# Patient Record
Sex: Male | Born: 1975 | Race: White | Hispanic: No | Marital: Married | Smoking: Never smoker
Health system: Southern US, Community
[De-identification: ages and names within clinical notes are randomized; demographics above are authoritative.]

## PROBLEM LIST (undated history)

## (undated) DIAGNOSIS — N5089 Other specified disorders of the male genital organs: Secondary | ICD-10-CM

## (undated) DIAGNOSIS — J45909 Unspecified asthma, uncomplicated: Secondary | ICD-10-CM

## (undated) DIAGNOSIS — R3915 Urgency of urination: Secondary | ICD-10-CM

## (undated) DIAGNOSIS — Z87442 Personal history of urinary calculi: Secondary | ICD-10-CM

## (undated) DIAGNOSIS — R350 Frequency of micturition: Secondary | ICD-10-CM

## (undated) DIAGNOSIS — Z9189 Other specified personal risk factors, not elsewhere classified: Secondary | ICD-10-CM

## (undated) DIAGNOSIS — C801 Malignant (primary) neoplasm, unspecified: Secondary | ICD-10-CM

---

## 2014-02-10 ENCOUNTER — Other Ambulatory Visit (HOSPITAL_COMMUNITY): Payer: Self-pay | Admitting: Urology

## 2014-02-10 ENCOUNTER — Other Ambulatory Visit: Payer: Self-pay | Admitting: Urology

## 2014-02-10 DIAGNOSIS — C6290 Malignant neoplasm of unspecified testis, unspecified whether descended or undescended: Secondary | ICD-10-CM

## 2014-02-11 ENCOUNTER — Encounter (HOSPITAL_COMMUNITY): Payer: Self-pay

## 2014-02-11 ENCOUNTER — Ambulatory Visit (HOSPITAL_COMMUNITY)
Admission: RE | Admit: 2014-02-11 | Discharge: 2014-02-11 | Disposition: A | Discharge status: Home / Self Care | Payer: Self-pay | Source: Ambulatory Visit | Attending: Urology | Admitting: Urology

## 2014-02-11 ENCOUNTER — Encounter (HOSPITAL_BASED_OUTPATIENT_CLINIC_OR_DEPARTMENT_OTHER): Payer: Self-pay | Admitting: *Deleted

## 2014-02-11 DIAGNOSIS — K4090 Unilateral inguinal hernia, without obstruction or gangrene, not specified as recurrent: Secondary | ICD-10-CM | POA: Insufficient documentation

## 2014-02-11 DIAGNOSIS — K5730 Diverticulosis of large intestine without perforation or abscess without bleeding: Secondary | ICD-10-CM | POA: Insufficient documentation

## 2014-02-11 DIAGNOSIS — C6290 Malignant neoplasm of unspecified testis, unspecified whether descended or undescended: Secondary | ICD-10-CM | POA: Insufficient documentation

## 2014-02-11 MED ORDER — IOHEXOL 300 MG/ML  SOLN
100.0000 mL | Freq: Once | INTRAMUSCULAR | Status: AC | PRN
  Administered 2014-02-11: 100 mL via INTRAVENOUS

## 2014-02-14 ENCOUNTER — Encounter (HOSPITAL_BASED_OUTPATIENT_CLINIC_OR_DEPARTMENT_OTHER): Payer: Self-pay | Admitting: *Deleted

## 2014-02-14 NOTE — Progress Notes (Signed)
   02/14/14 1033  OBSTRUCTIVE SLEEP APNEA  Have you ever been diagnosed with sleep apnea through a sleep study? No  Do you snore loudly (loud enough to be heard through closed doors)?  1  Do you often feel tired, fatigued, or sleepy during the daytime? 0  Has anyone observed you stop breathing during your sleep? 1  Do you have, or are you being treated for high blood pressure? 0  BMI more than 35 kg/m2? 1  Age over 36 years old? 0  Neck circumference greater than 40 cm/16 inches? 1  Gender: 1  Obstructive Sleep Apnea Score 5  Score 4 or greater  Results sent to PCP

## 2014-02-14 NOTE — Progress Notes (Signed)
NPO AFTER MN. ARRIVE AT 1100. NEEDS HG.

## 2014-02-15 ENCOUNTER — Encounter (HOSPITAL_BASED_OUTPATIENT_CLINIC_OR_DEPARTMENT_OTHER)
Admission: RE | Disposition: A | Discharge status: Home / Self Care | Payer: Self-pay | Source: Ambulatory Visit | Attending: Urology

## 2014-02-15 ENCOUNTER — Ambulatory Visit (HOSPITAL_BASED_OUTPATIENT_CLINIC_OR_DEPARTMENT_OTHER)
Admission: RE | Admit: 2014-02-15 | Discharge: 2014-02-15 | Disposition: A | Discharge status: Home / Self Care | Payer: Self-pay | Source: Ambulatory Visit | Attending: Urology | Admitting: Urology

## 2014-02-15 ENCOUNTER — Encounter (HOSPITAL_BASED_OUTPATIENT_CLINIC_OR_DEPARTMENT_OTHER): Payer: Self-pay

## 2014-02-15 ENCOUNTER — Ambulatory Visit (HOSPITAL_BASED_OUTPATIENT_CLINIC_OR_DEPARTMENT_OTHER): Payer: Self-pay | Admitting: Anesthesiology

## 2014-02-15 DIAGNOSIS — Z880 Allergy status to penicillin: Secondary | ICD-10-CM | POA: Insufficient documentation

## 2014-02-15 DIAGNOSIS — C6291 Malignant neoplasm of right testis, unspecified whether descended or undescended: Secondary | ICD-10-CM | POA: Insufficient documentation

## 2014-02-15 DIAGNOSIS — J45909 Unspecified asthma, uncomplicated: Secondary | ICD-10-CM | POA: Insufficient documentation

## 2014-02-15 DIAGNOSIS — R350 Frequency of micturition: Secondary | ICD-10-CM | POA: Insufficient documentation

## 2014-02-15 DIAGNOSIS — Z6841 Body Mass Index (BMI) 40.0 and over, adult: Secondary | ICD-10-CM | POA: Insufficient documentation

## 2014-02-15 DIAGNOSIS — Z91013 Allergy to seafood: Secondary | ICD-10-CM | POA: Insufficient documentation

## 2014-02-15 DIAGNOSIS — Z886 Allergy status to analgesic agent status: Secondary | ICD-10-CM | POA: Insufficient documentation

## 2014-02-15 DIAGNOSIS — R3915 Urgency of urination: Secondary | ICD-10-CM | POA: Insufficient documentation

## 2014-02-15 HISTORY — DX: Other specified disorders of the male genital organs: N50.89

## 2014-02-15 HISTORY — DX: Personal history of urinary calculi: Z87.442

## 2014-02-15 HISTORY — DX: Other specified personal risk factors, not elsewhere classified: Z91.89

## 2014-02-15 HISTORY — PX: ORCHIECTOMY: SHX2116

## 2014-02-15 HISTORY — DX: Urgency of urination: R39.15

## 2014-02-15 HISTORY — DX: Frequency of micturition: R35.0

## 2014-02-15 HISTORY — DX: Unspecified asthma, uncomplicated: J45.909

## 2014-02-15 LAB — POCT HEMOGLOBIN-HEMACUE: Hemoglobin: 14.6 g/dL (ref 13.0–17.0)

## 2014-02-15 SURGERY — ORCHIECTOMY
Anesthesia: General | Site: Groin | Laterality: Right

## 2014-02-15 MED ORDER — PROMETHAZINE HCL 25 MG/ML IJ SOLN
6.2500 mg | INTRAMUSCULAR | Status: DC | PRN
  Administered 2014-02-15: 6.25 mg via INTRAVENOUS
  Filled 2014-02-15: qty 1

## 2014-02-15 MED ORDER — OXYCODONE-ACETAMINOPHEN 5-325 MG PO TABS: 1.0000 | ORAL_TABLET | ORAL | Status: DC | PRN

## 2014-02-15 MED ORDER — LACTATED RINGERS IV SOLN
INTRAVENOUS | Status: DC
  Administered 2014-02-15 (×2): via INTRAVENOUS
  Filled 2014-02-15: qty 1000

## 2014-02-15 MED ORDER — PROPOFOL 10 MG/ML IV BOLUS
INTRAVENOUS | Status: DC | PRN
  Administered 2014-02-15: 200 mg via INTRAVENOUS

## 2014-02-15 MED ORDER — CIPROFLOXACIN IN D5W 400 MG/200ML IV SOLN
INTRAVENOUS | Status: AC
  Filled 2014-02-15: qty 200

## 2014-02-15 MED ORDER — BUPIVACAINE HCL (PF) 0.25 % IJ SOLN
INTRAMUSCULAR | Status: DC | PRN
  Administered 2014-02-15: 4 mL

## 2014-02-15 MED ORDER — CLINDAMYCIN PHOSPHATE 600 MG/50ML IV SOLN
600.0000 mg | Freq: Once | INTRAVENOUS | Status: DC
  Filled 2014-02-15: qty 50

## 2014-02-15 MED ORDER — FENTANYL CITRATE 0.05 MG/ML IJ SOLN
INTRAMUSCULAR | Status: AC
  Filled 2014-02-15: qty 6

## 2014-02-15 MED ORDER — MEPERIDINE HCL 25 MG/ML IJ SOLN
6.2500 mg | INTRAMUSCULAR | Status: DC | PRN
  Filled 2014-02-15: qty 1

## 2014-02-15 MED ORDER — FENTANYL CITRATE 0.05 MG/ML IJ SOLN
INTRAMUSCULAR | Status: DC | PRN
  Administered 2014-02-15: 25 ug via INTRAVENOUS
  Administered 2014-02-15 (×3): 50 ug via INTRAVENOUS
  Administered 2014-02-15: 25 ug via INTRAVENOUS
  Administered 2014-02-15: 50 ug via INTRAVENOUS
  Administered 2014-02-15 (×5): 25 ug via INTRAVENOUS
  Administered 2014-02-15 (×2): 50 ug via INTRAVENOUS
  Administered 2014-02-15: 25 ug via INTRAVENOUS

## 2014-02-15 MED ORDER — SODIUM CHLORIDE 0.9 % IR SOLN
Status: DC | PRN
  Administered 2014-02-15: 500 mL

## 2014-02-15 MED ORDER — DEXAMETHASONE SODIUM PHOSPHATE 4 MG/ML IJ SOLN
INTRAMUSCULAR | Status: DC | PRN
  Administered 2014-02-15: 10 mg via INTRAVENOUS

## 2014-02-15 MED ORDER — CIPROFLOXACIN IN D5W 400 MG/200ML IV SOLN
INTRAVENOUS | Status: DC | PRN
  Administered 2014-02-15: 400 mg via INTRAVENOUS

## 2014-02-15 MED ORDER — FENTANYL CITRATE 0.05 MG/ML IJ SOLN
INTRAMUSCULAR | Status: AC
  Filled 2014-02-15: qty 4

## 2014-02-15 MED ORDER — OXYCODONE HCL 5 MG PO TABS
5.0000 mg | ORAL_TABLET | Freq: Once | ORAL | Status: AC | PRN
  Administered 2014-02-15: 5 mg via ORAL
  Filled 2014-02-15: qty 1

## 2014-02-15 MED ORDER — OXYCODONE HCL 5 MG/5ML PO SOLN
5.0000 mg | Freq: Once | ORAL | Status: AC | PRN
  Filled 2014-02-15: qty 5

## 2014-02-15 MED ORDER — MIDAZOLAM HCL 5 MG/5ML IJ SOLN
INTRAMUSCULAR | Status: DC | PRN
  Administered 2014-02-15: 2 mg via INTRAVENOUS

## 2014-02-15 MED ORDER — HYDROMORPHONE HCL 1 MG/ML IJ SOLN
INTRAMUSCULAR | Status: AC
  Filled 2014-02-15: qty 1

## 2014-02-15 MED ORDER — PROMETHAZINE HCL 25 MG/ML IJ SOLN
INTRAMUSCULAR | Status: AC
  Filled 2014-02-15: qty 1

## 2014-02-15 MED ORDER — OXYCODONE HCL 5 MG PO TABS
ORAL_TABLET | ORAL | Status: AC
  Filled 2014-02-15: qty 1

## 2014-02-15 MED ORDER — CEFAZOLIN SODIUM-DEXTROSE 2-3 GM-% IV SOLR
INTRAVENOUS | Status: AC
  Filled 2014-02-15: qty 50

## 2014-02-15 MED ORDER — ONDANSETRON HCL 4 MG/2ML IJ SOLN
INTRAMUSCULAR | Status: DC | PRN
  Administered 2014-02-15: 4 mg via INTRAVENOUS

## 2014-02-15 MED ORDER — HYDROMORPHONE HCL 1 MG/ML IJ SOLN
0.2500 mg | INTRAMUSCULAR | Status: DC | PRN
Start: 2014-02-15 — End: 2014-02-15
  Administered 2014-02-15 (×4): 0.5 mg via INTRAVENOUS
  Filled 2014-02-15: qty 1

## 2014-02-15 MED ORDER — CEFAZOLIN SODIUM-DEXTROSE 2-3 GM-% IV SOLR
2.0000 g | INTRAVENOUS | Status: DC
  Filled 2014-02-15: qty 50

## 2014-02-15 MED ORDER — CEFAZOLIN SODIUM 1-5 GM-% IV SOLN
1.0000 g | INTRAVENOUS | Status: DC
  Filled 2014-02-15: qty 50

## 2014-02-15 MED ORDER — MIDAZOLAM HCL 2 MG/2ML IJ SOLN
INTRAMUSCULAR | Status: AC
  Filled 2014-02-15: qty 2

## 2014-02-15 MED ORDER — LIDOCAINE HCL (CARDIAC) 20 MG/ML IV SOLN
INTRAVENOUS | Status: DC | PRN
  Administered 2014-02-15: 60 mg via INTRAVENOUS

## 2014-02-15 SURGICAL SUPPLY — 54 items
BENZOIN TINCTURE PRP APPL 2/3 (GAUZE/BANDAGES/DRESSINGS) ×3 IMPLANT
BLADE CLIPPER SURG (BLADE) ×3 IMPLANT
BLADE SURG 15 STRL LF DISP TIS (BLADE) ×1 IMPLANT
BLADE SURG 15 STRL SS (BLADE) ×2
BNDG GAUZE ELAST 4 BULKY (GAUZE/BANDAGES/DRESSINGS) ×3 IMPLANT
CANISTER SUCTION 1200CC (MISCELLANEOUS) ×3 IMPLANT
CLEANER CAUTERY TIP 5X5 PAD (MISCELLANEOUS) ×1 IMPLANT
CLOSURE WOUND 1/4X4 (GAUZE/BANDAGES/DRESSINGS) ×1
CLOTH BEACON ORANGE TIMEOUT ST (SAFETY) ×3 IMPLANT
COVER MAYO STAND STRL (DRAPES) ×3 IMPLANT
COVER TABLE BACK 60X90 (DRAPES) ×3 IMPLANT
DERMABOND ADVANCED (GAUZE/BANDAGES/DRESSINGS) ×2
DERMABOND ADVANCED .7 DNX12 (GAUZE/BANDAGES/DRESSINGS) ×1 IMPLANT
DISSECTOR ROUND CHERRY 3/8 STR (MISCELLANEOUS) ×3 IMPLANT
DRAIN PENROSE 18X1/4 LTX STRL (WOUND CARE) ×3 IMPLANT
DRAPE LAPAROTOMY TRNSV 102X78 (DRAPE) ×3 IMPLANT
DRSG TEGADERM 4X4.75 (GAUZE/BANDAGES/DRESSINGS) ×3 IMPLANT
ELECT REM PT RETURN 9FT ADLT (ELECTROSURGICAL) ×3
ELECTRODE REM PT RTRN 9FT ADLT (ELECTROSURGICAL) ×1 IMPLANT
GLOVE BIO SURGEON STRL SZ7 (GLOVE) ×3 IMPLANT
GLOVE BIO SURGEON STRL SZ7.5 (GLOVE) ×3 IMPLANT
GLOVE INDICATOR 7.5 STRL GRN (GLOVE) ×3 IMPLANT
GLOVE SURG SS PI 7.5 STRL IVOR (GLOVE) ×6 IMPLANT
GOWN STRL REUS W/ TWL LRG LVL3 (GOWN DISPOSABLE) ×1 IMPLANT
GOWN STRL REUS W/ TWL XL LVL3 (GOWN DISPOSABLE) ×2 IMPLANT
GOWN STRL REUS W/TWL LRG LVL3 (GOWN DISPOSABLE) ×2
GOWN STRL REUS W/TWL XL LVL3 (GOWN DISPOSABLE) ×4
NEEDLE HYPO 25X1 1.5 SAFETY (NEEDLE) ×3 IMPLANT
NS IRRIG 500ML POUR BTL (IV SOLUTION) IMPLANT
PACK BASIN DAY SURGERY FS (CUSTOM PROCEDURE TRAY) ×3 IMPLANT
PAD CLEANER CAUTERY TIP 5X5 (MISCELLANEOUS) ×2
PENCIL BUTTON HOLSTER BLD 10FT (ELECTRODE) ×3 IMPLANT
SPONGE GAUZE 4X4 12PLY STER LF (GAUZE/BANDAGES/DRESSINGS) ×3 IMPLANT
STRIP CLOSURE SKIN 1/4X4 (GAUZE/BANDAGES/DRESSINGS) ×2 IMPLANT
SUPPORT SCROTAL LG STRP (MISCELLANEOUS) ×2 IMPLANT
SUPPORTER ATHLETIC LG (MISCELLANEOUS) ×1
SUT MNCRL AB 4-0 PS2 18 (SUTURE) ×3 IMPLANT
SUT SILK 0 SH 30 (SUTURE) IMPLANT
SUT SILK 0 TIES 10X30 (SUTURE) ×3 IMPLANT
SUT SILK 2 0 SH (SUTURE) IMPLANT
SUT VIC AB 0 CT1 36 (SUTURE) ×3 IMPLANT
SUT VIC AB 2-0 SH 27 (SUTURE) ×2
SUT VIC AB 2-0 SH 27XBRD (SUTURE) ×1 IMPLANT
SUT VIC AB 3-0 SH 27 (SUTURE) ×2
SUT VIC AB 3-0 SH 27X BRD (SUTURE) ×1 IMPLANT
SUT VICRYL 0 TIES 12 18 (SUTURE) ×3 IMPLANT
SYR 20CC LL (SYRINGE) ×6 IMPLANT
SYR BULB IRRIGATION 50ML (SYRINGE) ×3 IMPLANT
SYR CONTROL 10ML LL (SYRINGE) ×3 IMPLANT
TOWEL OR 17X24 6PK STRL BLUE (TOWEL DISPOSABLE) ×6 IMPLANT
TUBE CONNECTING 12'X1/4 (SUCTIONS) ×1
TUBE CONNECTING 12X1/4 (SUCTIONS) ×2 IMPLANT
WATER STERILE IRR 500ML POUR (IV SOLUTION) IMPLANT
YANKAUER SUCT BULB TIP NO VENT (SUCTIONS) ×3 IMPLANT

## 2014-02-15 NOTE — H&P (Signed)
H&P  Chief Complaint: Right testis mass  History of Present Illness: Devin Munoz is a 38 y.o. male with an atrophic right testicle and palpable mass confirmed on ultrasound.  To OR today for right radical orchiectomy.  Past Medical History  Diagnosis Date  . Testicular mass     RIGHT  . 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 Frequency of urination   . Urgency of urination   . At risk for sleep apnea     STOP-BANG= 5     SENT TO PCP 02-14-2014  . History of kidney stones     History reviewed. No pertinent past surgical history.  Home Medications:    Medication List    Notice    Cannot display discharge medications because the patient has not yet been admitted.      Allergies:  Allergies  Allergen Reactions  . Penicillins Anaphylaxis, Hives and Swelling  . Shellfish Allergy Hives    ALL TYPES  . Asa [Aspirin] Rash    History reviewed. No pertinent family history.  Social History:  reports that he has never smoked. He has never used smokeless tobacco. He reports that he does not drink alcohol or use illicit drugs.  ROS: A complete review of systems was performed.  All systems are negative except for pertinent findings as noted.  Physical Exam:  Vital signs in last 24 hours: Weight:  [145.151 kg (320 lb)] 145.151 kg (320 lb) (11/16 1033) Constitutional:  Alert and oriented, No acute distress Cardiovascular: Regular rate and rhythm, No JVD Respiratory: Normal respiratory effort, Lungs clear bilaterally GI: Abdomen is soft, nontender, nondistended, no abdominal masses GU: No CVA tenderness, right atrophic testicle with associated firm mass Lymphatic: No lymphadenopathy Neurologic: Grossly intact, no focal deficits Psychiatric: Normal mood and affect  Laboratory Data:  No results for input(s): WBC, HGB, HCT, PLT in the last 72 hours.  No results for input(s): NA, K, CL, GLUCOSE, BUN, CALCIUM, CREATININE in the last 72 hours.  Invalid input(s):  CO3   No results found for this or any previous visit (from the past 24 hour(s)). No results found for this or any previous visit (from the past 240 hour(s)).  Renal Function: No results for input(s): CREATININE in the last 168 hours. CrCl cannot be calculated (Patient has no serum creatinine result on file.).  Radiologic Imaging: No results found.  Impression/Assessment:  38 yo with right testis mass concerning for malignancy.  Plan is to go to OR today for right inguinal radical orchiectomy.  Risks and benefits were discussed in clinic this past week and all questions were answered.  Plan:  To OR today for right radical orchiectomy

## 2014-02-15 NOTE — Discharge Instructions (Addendum)
Discharge instructions following scrotal surgery  Call your doctor for:  Fever is greater than 100.5  Severe nausea or vomiting  Increasing pain not controlled by pain medication  Increasing redness or drainage from incisions  The number for questions or concerns is [phone]  Activity level: No lifting greater than 10 pounds (about equal to milk) for the next 2 weeks or until cleared to do so at follow-up appointment.  Otherwise activity as tolerated by comfort level.  Diet: May resume your regular diet as tolerated  Driving: No driving while still taking opiate pain medications (weight at least 6-8 hours after last dose).  No driving if you are still sore from surgery as it may limit ability to react quickly if necessary.   Shower/bath: May shower and get incision wet pad dry immediately following.  Do not scrub vigorously for the next 2-3 weeks.  Do not soak incision (ID soaking in bath or swimming) until told he may do so by Dr. Baltazar Najjar, as this may promote a wound infection.  Wound care: He may cover wounds with sterile gauze as needed to prevent incisions rubbing on close follow-up in any seepage.  Where tight fitting underpants for at least 2 weeks.  He should apply cold compresses (ice or sac of frozen peas/corn) to your scrotum for at least 48 hours to reduce the swelling.  You should expect that his scrotum will swell up initially and then get smaller over the next 2-4 weeks.  Follow-up appointments: Follow-up appointment will be scheduled with Dr. Baltazar Najjar in 2 weeks for a wound check.   Post Anesthesia Home Care Instructions  Activity: Get plenty of rest for the remainder of the day. A responsible adult should stay with you for 24 hours following the procedure.  For the next 24 hours, DO NOT: -Drive a car -Paediatric nurse -Drink alcoholic beverages -Take any medication unless instructed by your physician -Make any legal decisions or sign important  papers.  Meals: Start with liquid foods such as gelatin or soup. Progress to regular foods as tolerated. Avoid greasy, spicy, heavy foods. If nausea and/or vomiting occur, drink only clear liquids until the nausea and/or vomiting subsides. Call your physician if vomiting continues.  Special Instructions/Symptoms: Your throat may feel dry or sore from the anesthesia or the breathing tube placed in your throat during surgery. If this causes discomfort, gargle with warm salt water. The discomfort should disappear within 24 hours.

## 2014-02-15 NOTE — Op Note (Signed)
Preoperative diagnosis:  1. Right testis mass  Postoperative diagnosis: 1. Same  Procedure(s): 1. Right radical orchiectomy  Surgeon: Lowella Bandy  Resident: Langley Adie, MD  Anesthesia: General  Complications: None apparent  EBL: Minimal  Specimens: Right testicle and cord for final pathology  Disposition of specimens: To pathology  Intraoperative findings: Right atrophic testicle, right solid mass on palpation of testicle  Indication: 37 to with right atrophic testicle and solid mass concerning for malignancy  Description of procedure:  After being properly identified in the preoperative holding area, the patient was taken to the operating room and induced to general anesthesia in the supine position. He was prepped and draped in the usual sterile fashion and a final timeout was called confirming information correct.  We began by sharply incising along Langer's lines approximately 2 cm cephalad to the right inguinal ligament. Using electrocautery, we dissected down to the fascia and opened this sharply extending down to the external ring. The ilioinguinal nerve was identified and swept off of the posterior aspect of the fascia. This is excluded medially from the dissection. The cord was then isolated and a Penrose drain passed around it. The proximal cord was then clamped twice with Claiborne Billings clamps for oncologic control. We then proceeded to deliver the right testicle from the right hemiscrotum. The gubernaculum was taken with electrocautery and attachments from cremasteric fibers were dissected free as well. At this point, the testicle was completely mobilized out of the incision. The cord was clamped again distal to the 2 Kelly clamps and then sharply dissected free. The testicle and cord were sent for final pathology. The cord was suture ligated twice using 0 Vicryl suture. A 0 silk tie was then used proximally for hemostasis as well as possible identification in the future should  retroperitoneal lymph node dissection be necessary.  We inspected the cord and appreciated good hemostasis. We then irrigated the incision with antibiotic irrigation. Further hemostasis was achieved with electrocautery.  Within tender attention towards closing. The fascia was reapproximated in a running fashion using 2-0 Vicryl suture. Subcutaneous tissues reapproximated using 3-0 Vicryl suture. Subcuticular 4-0 Monocryl was used at the skin and Dermabond used for dressing.  Patient was then awoken from general anesthesia having tolerated the procedure well and no apparent complications.

## 2014-02-15 NOTE — Anesthesia Postprocedure Evaluation (Signed)
Anesthesia Post Note  Patient: Devin Munoz  Procedure(s) Performed: Procedure(s) (LRB): INGUINAL RADICAL ORCHIECTOMY (Right)  Anesthesia type: General  Patient location: PACU  Post pain: Pain level controlled  Post assessment: Post-op Vital signs reviewed  Last Vitals: BP 145/84 mmHg  Pulse 78  Temp(Src) 36.8 C (Oral)  Resp 18  Ht 6\' 2"  (1.88 m)  Wt 319 lb 8 oz (144.924 kg)  BMI 41.00 kg/m2  SpO2 99%  Post vital signs: Reviewed  Level of consciousness: sedated  Complications: No apparent anesthesia complications

## 2014-02-15 NOTE — Anesthesia Preprocedure Evaluation (Signed)
Anesthesia Evaluation  Patient identified by MRN, date of birth, ID band Patient awake    Reviewed: Allergy & Precautions, H&P , NPO status , Patient's Chart, lab work & pertinent test results  Airway Mallampati: II  TM Distance: >3 FB Neck ROM: Full    Dental no notable dental hx.    Pulmonary asthma ,  breath sounds clear to auscultation  Pulmonary exam normal       Cardiovascular negative cardio ROS  Rhythm:Regular Rate:Normal     Neuro/Psych negative neurological ROS  negative psych ROS   GI/Hepatic negative GI ROS, Neg liver ROS,   Endo/Other  Morbid obesity  Renal/GU negative Renal ROS     Musculoskeletal negative musculoskeletal ROS (+)   Abdominal   Peds  Hematology negative hematology ROS (+)   Anesthesia Other Findings   Reproductive/Obstetrics                             Anesthesia Physical Anesthesia Plan  ASA: III  Anesthesia Plan: General   Post-op Pain Management:    Induction: Intravenous  Airway Management Planned: LMA  Additional Equipment:   Intra-op Plan:   Post-operative Plan: Extubation in OR  Informed Consent: I have reviewed the patients History and Physical, chart, labs and discussed the procedure including the risks, benefits and alternatives for the proposed anesthesia with the patient or authorized representative who has indicated his/her understanding and acceptance.   Dental advisory given  Plan Discussed with: CRNA  Anesthesia Plan Comments:         Anesthesia Quick Evaluation

## 2014-02-15 NOTE — Transfer of Care (Signed)
Immediate Anesthesia Transfer of Care Note  Patient: Devin Munoz  Procedure(s) Performed: Procedure(s) (LRB): INGUINAL RADICAL ORCHIECTOMY (Right)  Patient Location: PACU  Anesthesia Type: General  Level of Consciousness: awake, oriented, sedated and patient cooperative  Airway & Oxygen Therapy: Patient Spontanous Breathing and Patient connected to face mask oxygen  Post-op Assessment: Report given to PACU RN and Post -op Vital signs reviewed and stable  Post vital signs: Reviewed and stable  Complications: No apparent anesthesia complications

## 2014-02-15 NOTE — Anesthesia Procedure Notes (Signed)
Procedure Name: LMA Insertion Date/Time: 02/15/2014 12:50 PM Performed by: Denna Haggard D Pre-anesthesia Checklist: Patient identified, Emergency Drugs available, Suction available and Patient being monitored Patient Re-evaluated:Patient Re-evaluated prior to inductionOxygen Delivery Method: Circle System Utilized Preoxygenation: Pre-oxygenation with 100% oxygen Intubation Type: IV induction Ventilation: Mask ventilation without difficulty LMA: LMA inserted LMA Size: 5.0 Number of attempts: 1 Airway Equipment and Method: bite block Placement Confirmation: positive ETCO2 Tube secured with: Tape Dental Injury: Teeth and Oropharynx as per pre-operative assessment

## 2014-02-17 ENCOUNTER — Encounter (HOSPITAL_BASED_OUTPATIENT_CLINIC_OR_DEPARTMENT_OTHER): Payer: Self-pay | Admitting: Urology

## 2014-05-03 ENCOUNTER — Other Ambulatory Visit (HOSPITAL_COMMUNITY): Payer: Self-pay | Admitting: Urology

## 2014-05-03 DIAGNOSIS — C6290 Malignant neoplasm of unspecified testis, unspecified whether descended or undescended: Secondary | ICD-10-CM

## 2014-05-04 ENCOUNTER — Other Ambulatory Visit (HOSPITAL_COMMUNITY): Payer: Self-pay | Admitting: Urology

## 2014-05-04 DIAGNOSIS — C6290 Malignant neoplasm of unspecified testis, unspecified whether descended or undescended: Secondary | ICD-10-CM

## 2014-05-13 ENCOUNTER — Ambulatory Visit (HOSPITAL_COMMUNITY)
Admission: RE | Admit: 2014-05-13 | Discharge: 2014-05-13 | Disposition: A | Discharge status: Home / Self Care | Payer: Self-pay | Source: Ambulatory Visit | Attending: Urology | Admitting: Urology

## 2014-05-13 ENCOUNTER — Encounter (HOSPITAL_COMMUNITY): Payer: Self-pay

## 2014-05-13 DIAGNOSIS — C6290 Malignant neoplasm of unspecified testis, unspecified whether descended or undescended: Secondary | ICD-10-CM | POA: Insufficient documentation

## 2014-05-13 MED ORDER — IOHEXOL 300 MG/ML  SOLN
100.0000 mL | Freq: Once | INTRAMUSCULAR | Status: AC | PRN
  Administered 2014-05-13: 100 mL via INTRAVENOUS

## 2014-05-13 MED ORDER — IOHEXOL 300 MG/ML  SOLN
50.0000 mL | Freq: Once | INTRAMUSCULAR | Status: AC | PRN
  Administered 2014-05-13: 50 mL via ORAL

## 2014-07-18 ENCOUNTER — Other Ambulatory Visit (HOSPITAL_COMMUNITY): Payer: Self-pay | Admitting: Urology

## 2014-07-18 DIAGNOSIS — C6290 Malignant neoplasm of unspecified testis, unspecified whether descended or undescended: Secondary | ICD-10-CM

## 2014-08-10 ENCOUNTER — Other Ambulatory Visit (HOSPITAL_COMMUNITY): Payer: Self-pay | Admitting: Urology

## 2014-08-10 ENCOUNTER — Encounter (HOSPITAL_COMMUNITY): Payer: Self-pay

## 2014-08-10 ENCOUNTER — Ambulatory Visit (HOSPITAL_COMMUNITY)
Admission: RE | Admit: 2014-08-10 | Discharge: 2014-08-10 | Disposition: A | Discharge status: Home / Self Care | Payer: Self-pay | Source: Ambulatory Visit | Attending: Urology | Admitting: Urology

## 2014-08-10 DIAGNOSIS — C6290 Malignant neoplasm of unspecified testis, unspecified whether descended or undescended: Secondary | ICD-10-CM

## 2014-08-10 DIAGNOSIS — R109 Unspecified abdominal pain: Secondary | ICD-10-CM | POA: Insufficient documentation

## 2014-08-10 HISTORY — DX: Malignant (primary) neoplasm, unspecified: C80.1

## 2014-08-10 MED ORDER — IOHEXOL 300 MG/ML  SOLN
100.0000 mL | Freq: Once | INTRAMUSCULAR | Status: AC | PRN
  Administered 2014-08-10: 100 mL via INTRAVENOUS

## 2014-09-29 ENCOUNTER — Other Ambulatory Visit (HOSPITAL_COMMUNITY): Payer: Self-pay | Admitting: Urology

## 2014-09-29 ENCOUNTER — Other Ambulatory Visit (HOSPITAL_COMMUNITY): Payer: Self-pay

## 2014-09-29 DIAGNOSIS — C6290 Malignant neoplasm of unspecified testis, unspecified whether descended or undescended: Secondary | ICD-10-CM

## 2014-10-19 ENCOUNTER — Other Ambulatory Visit: Payer: Self-pay | Admitting: Urology

## 2014-10-19 NOTE — Patient Instructions (Addendum)
[place]  10/19/2014   Your procedure is scheduled on:   10-21-2014 Friday  Enter through [facility]  Entrance and follow signs to [facility]. Arrive at       3:00 PM.  (Limit 1 person with you).  Call this number if you have problems the morning of surgery: [phone]  Or Presurgical Testing [phone].   For Living Will and/or Health Care Power Attorney Forms: please provide copy for your medical record,may bring AM of surgery(Forms should be already notarized -we do not provide this service).(10-20-14  No information preferred today).    Do not eat food/ or drink: After Midnight.  Exception: may have clear liquids:up to 6 Hours before arrival. Nothing after: 1100 !M  Clear liquids include soda, tea, black coffee, apple or grape juice, broth.  Take these medicines the morning of surgery with A SIP OF WATER: [place].   Do not wear jewelry, make-up or nail polish.  Do not wear deodorant, lotions, powders, or perfumes.   Do not shave legs and under arms- 48 hours(2 days) prior to first CHG shower.(Shaving face and neck okay.)  Do not bring valuables to the hospital.(Hospital is not responsible for lost valuables).  Contacts, dentures or removable bridgework, body piercing, hair pins may not be worn into surgery.  Leave suitcase in the car. After surgery it may be brought to your room.  For patients admitted to the hospital, checkout time is 11:00 AM the day of discharge.(Restricted visitors-Any Persons displaying flu-like symptoms or illness).    Patients discharged the day of surgery will not be allowed to drive home. Must have responsible person with you x 24 hours once discharged.  Name and phone number of your driver: Almyra Free -spouse [phone] cell.     Please read over the following fact sheets that you were given:  CHG(Chlorhexidine Gluconate 4% Surgical Soap) use.           [facility] - Preparing for Surgery Before surgery, you can play an  important role.  Because skin is not sterile, your skin needs to be as free of germs as possible.  You can reduce the number of germs on your skin by washing with CHG (chlorahexidine gluconate) soap before surgery.  CHG is an antiseptic cleaner which kills germs and bonds with the skin to continue killing germs even after washing. Please DO NOT use if you have an allergy to CHG or antibacterial soaps.  If your skin becomes reddened/irritated stop using the CHG and inform your nurse when you arrive at Short Stay. Do not shave (including legs and underarms) for at least 48 hours prior to the first CHG shower.  You may shave your face/neck. Please follow these instructions carefully:  1.  Shower with CHG Soap the night before surgery and the  morning of Surgery.  2.  If you choose to wash your hair, wash your hair first as usual with your  normal  shampoo.  3.  After you shampoo, rinse your hair and body thoroughly to remove the  shampoo.                           4.  Use CHG as you would any other liquid soap.  You can apply chg directly  to the skin and wash                       Gently with a  scrungie or clean washcloth.  5.  Apply the CHG Soap to your body ONLY FROM THE NECK DOWN.   Do not use on face/ open                           Wound or open sores. Avoid contact with eyes, ears mouth and genitals (private parts).                       Wash face,  Genitals (private parts) with your normal soap.             6.  Wash thoroughly, paying special attention to the area where your surgery  will be performed.  7.  Thoroughly rinse your body with warm water from the neck down.  8.  DO NOT shower/wash with your normal soap after using and rinsing off  the CHG Soap.                9.  Pat yourself dry with a clean towel.            10.  Wear clean pajamas.            11.  Place clean sheets on your bed the night of your first shower and do not  sleep with pets. Day of Surgery : Do not apply any  lotions/deodorants the morning of surgery.  Please wear clean clothes to the hospital/surgery center.  FAILURE TO FOLLOW THESE INSTRUCTIONS MAY RESULT IN THE CANCELLATION OF YOUR SURGERY PATIENT SIGNATURE_________________________________  NURSE SIGNATURE__________________________________  ________________________________________________________________________

## 2014-10-19 NOTE — Progress Notes (Signed)
Please put orders in Epic surgery 10-21-14 pre op 10-20-14 Thanks

## 2014-10-20 ENCOUNTER — Encounter (HOSPITAL_COMMUNITY)
Admission: RE | Admit: 2014-10-20 | Discharge: 2014-10-20 | Disposition: A | Discharge status: Home / Self Care | Payer: Self-pay | Source: Ambulatory Visit | Attending: Urology | Admitting: Urology

## 2014-10-20 ENCOUNTER — Encounter (HOSPITAL_COMMUNITY): Payer: Self-pay

## 2014-10-20 DIAGNOSIS — N211 Calculus in urethra: Secondary | ICD-10-CM | POA: Insufficient documentation

## 2014-10-20 DIAGNOSIS — Z01812 Encounter for preprocedural laboratory examination: Secondary | ICD-10-CM | POA: Insufficient documentation

## 2014-10-20 LAB — BASIC METABOLIC PANEL
Anion gap: 7 (ref 5–15)
BUN: 14 mg/dL (ref 6–20)
CHLORIDE: 106 mmol/L (ref 101–111)
CO2: 27 mmol/L (ref 22–32)
CREATININE: 0.85 mg/dL (ref 0.61–1.24)
Calcium: 9 mg/dL (ref 8.9–10.3)
GFR calc non Af Amer: >60 mL/min (ref >60)
GLUCOSE: 101 mg/dL — AB (ref 65–99)
Potassium: 4.4 mmol/L (ref 3.5–5.1)
Sodium: 140 mmol/L (ref 135–145)

## 2014-10-20 NOTE — Pre-Procedure Instructions (Signed)
CXR 08-10-14, CT abd/pelvis 08-10-14 Epic.

## 2014-10-20 NOTE — Anesthesia Preprocedure Evaluation (Addendum)
Anesthesia Evaluation  Patient identified by MRN, date of birth, ID band Patient awake    Reviewed: Allergy & Precautions, NPO status , Patient's Chart, lab work & pertinent test results  History of Anesthesia Complications Negative for: history of anesthetic complications  Airway Mallampati: II  TM Distance: >3 FB Neck ROM: Full    Dental no notable dental hx. (+) Dental Advisory Given   Pulmonary asthma ,  breath sounds clear to auscultation  Pulmonary exam normal       Cardiovascular negative cardio ROS Normal cardiovascular examRhythm:Regular Rate:Normal     Neuro/Psych negative neurological ROS  negative psych ROS   GI/Hepatic negative GI ROS, Neg liver ROS,   Endo/Other  Morbid obesity  Renal/GU negative Renal ROS  negative genitourinary   Musculoskeletal negative musculoskeletal ROS (+)   Abdominal (+) + obese,   Peds negative pediatric ROS (+)  Hematology negative hematology ROS (+)   Anesthesia Other Findings   Reproductive/Obstetrics negative OB ROS                            Anesthesia Physical Anesthesia Plan  ASA: III  Anesthesia Plan: General   Post-op Pain Management:    Induction: Intravenous  Airway Management Planned: LMA  Additional Equipment:   Intra-op Plan:   Post-operative Plan: Extubation in OR  Informed Consent: I have reviewed the patients History and Physical, chart, labs and discussed the procedure including the risks, benefits and alternatives for the proposed anesthesia with the patient or authorized representative who has indicated his/her understanding and acceptance.   Dental advisory given  Plan Discussed with: CRNA  Anesthesia Plan Comments:         Anesthesia Quick Evaluation

## 2014-10-20 NOTE — Progress Notes (Signed)
   10/20/14 1045  OBSTRUCTIVE SLEEP APNEA  Have you ever been diagnosed with sleep apnea through a sleep study? No  Do you snore loudly (loud enough to be heard through closed doors)?  1  Do you often feel tired, fatigued, or sleepy during the daytime? 0  Has anyone observed you stop breathing during your sleep? 1  Do you have, or are you being treated for high blood pressure? 0  BMI more than 35 kg/m2? 1  Age over 37 years old? 0  Neck circumference greater than 40 cm/16 inches? 1  Gender: 1  Obstructive Sleep Apnea Score 5

## 2014-10-20 NOTE — Progress Notes (Signed)
Your Pt has screened with an elevated risk for obstructive sleep apnea using the Stop-Bang tool during a presurgical  Visit. A score of four or greater is an elevated risk. 

## 2014-10-21 ENCOUNTER — Encounter (HOSPITAL_COMMUNITY)
Admission: RE | Disposition: A | Discharge status: Home / Self Care | Payer: Self-pay | Source: Ambulatory Visit | Attending: Urology

## 2014-10-21 ENCOUNTER — Ambulatory Visit (HOSPITAL_COMMUNITY)
Admission: RE | Admit: 2014-10-21 | Discharge: 2014-10-21 | Disposition: A | Discharge status: Home / Self Care | Payer: Self-pay | Source: Ambulatory Visit | Attending: Urology | Admitting: Urology

## 2014-10-21 ENCOUNTER — Ambulatory Visit (HOSPITAL_COMMUNITY): Payer: Self-pay | Admitting: Anesthesiology

## 2014-10-21 ENCOUNTER — Encounter (HOSPITAL_COMMUNITY): Payer: Self-pay | Admitting: *Deleted

## 2014-10-21 DIAGNOSIS — N201 Calculus of ureter: Secondary | ICD-10-CM | POA: Insufficient documentation

## 2014-10-21 DIAGNOSIS — Z79899 Other long term (current) drug therapy: Secondary | ICD-10-CM | POA: Insufficient documentation

## 2014-10-21 DIAGNOSIS — Z791 Long term (current) use of non-steroidal anti-inflammatories (NSAID): Secondary | ICD-10-CM | POA: Insufficient documentation

## 2014-10-21 DIAGNOSIS — J45909 Unspecified asthma, uncomplicated: Secondary | ICD-10-CM | POA: Insufficient documentation

## 2014-10-21 DIAGNOSIS — Z841 Family history of disorders of kidney and ureter: Secondary | ICD-10-CM | POA: Insufficient documentation

## 2014-10-21 DIAGNOSIS — Z6841 Body Mass Index (BMI) 40.0 and over, adult: Secondary | ICD-10-CM | POA: Insufficient documentation

## 2014-10-21 DIAGNOSIS — Z7952 Long term (current) use of systemic steroids: Secondary | ICD-10-CM | POA: Insufficient documentation

## 2014-10-21 HISTORY — PX: CYSTOSCOPY WITH RETROGRADE PYELOGRAM, URETEROSCOPY AND STENT PLACEMENT: SHX5789

## 2014-10-21 HISTORY — PX: HOLMIUM LASER APPLICATION: SHX5852

## 2014-10-21 SURGERY — CYSTOURETEROSCOPY, WITH RETROGRADE PYELOGRAM AND STENT INSERTION
Anesthesia: General | Site: Ureter | Laterality: Left

## 2014-10-21 MED ORDER — PROPOFOL 10 MG/ML IV BOLUS
INTRAVENOUS | Status: DC | PRN
  Administered 2014-10-21 (×2): 100 mg via INTRAVENOUS
  Administered 2014-10-21: 200 mg via INTRAVENOUS

## 2014-10-21 MED ORDER — FENTANYL CITRATE (PF) 100 MCG/2ML IJ SOLN
INTRAMUSCULAR | Status: AC
  Filled 2014-10-21: qty 2

## 2014-10-21 MED ORDER — ONDANSETRON HCL 4 MG/2ML IJ SOLN
INTRAMUSCULAR | Status: AC
  Filled 2014-10-21: qty 2

## 2014-10-21 MED ORDER — CIPROFLOXACIN IN D5W 400 MG/200ML IV SOLN
400.0000 mg | INTRAVENOUS | Status: AC
  Administered 2014-10-21: 400 mg via INTRAVENOUS

## 2014-10-21 MED ORDER — LACTATED RINGERS IV SOLN
INTRAVENOUS | Status: DC
  Administered 2014-10-21: 1000 mL via INTRAVENOUS

## 2014-10-21 MED ORDER — LIDOCAINE HCL (CARDIAC) 20 MG/ML IV SOLN
INTRAVENOUS | Status: DC | PRN
  Administered 2014-10-21: 100 mg via INTRAVENOUS

## 2014-10-21 MED ORDER — MIDAZOLAM HCL 5 MG/5ML IJ SOLN
INTRAMUSCULAR | Status: DC | PRN
  Administered 2014-10-21: 2 mg via INTRAVENOUS

## 2014-10-21 MED ORDER — OXYCODONE-ACETAMINOPHEN 5-325 MG PO TABS: 1.0000 | ORAL_TABLET | ORAL | Status: DC | PRN

## 2014-10-21 MED ORDER — PROMETHAZINE HCL 25 MG/ML IJ SOLN
6.2500 mg | INTRAMUSCULAR | Status: AC | PRN
Start: 2014-10-21 — End: 2014-10-21
  Administered 2014-10-21 (×2): 6.25 mg via INTRAVENOUS

## 2014-10-21 MED ORDER — HYDROMORPHONE HCL 1 MG/ML IJ SOLN
INTRAMUSCULAR | Status: AC
  Filled 2014-10-21: qty 1

## 2014-10-21 MED ORDER — IOHEXOL 300 MG/ML  SOLN
INTRAMUSCULAR | Status: DC | PRN
  Administered 2014-10-21: 1 mg via URETHRAL

## 2014-10-21 MED ORDER — ONDANSETRON HCL 4 MG/2ML IJ SOLN
INTRAMUSCULAR | Status: DC | PRN
  Administered 2014-10-21: 4 mg via INTRAVENOUS

## 2014-10-21 MED ORDER — CIPROFLOXACIN IN D5W 400 MG/200ML IV SOLN
INTRAVENOUS | Status: AC
  Filled 2014-10-21: qty 200

## 2014-10-21 MED ORDER — BELLADONNA ALKALOIDS-OPIUM 16.2-60 MG RE SUPP
RECTAL | Status: AC
  Filled 2014-10-21: qty 1

## 2014-10-21 MED ORDER — ONDANSETRON HCL 4 MG/2ML IJ SOLN
4.0000 mg | Freq: Once | INTRAMUSCULAR | Status: AC | PRN
  Administered 2014-10-21: 4 mg via INTRAVENOUS

## 2014-10-21 MED ORDER — HYDROMORPHONE HCL 1 MG/ML IJ SOLN
0.2500 mg | INTRAMUSCULAR | Status: DC | PRN
  Administered 2014-10-21 (×6): 0.25 mg via INTRAVENOUS

## 2014-10-21 MED ORDER — FENTANYL CITRATE (PF) 100 MCG/2ML IJ SOLN
INTRAMUSCULAR | Status: DC | PRN
  Administered 2014-10-21: 75 ug via INTRAVENOUS
  Administered 2014-10-21: 25 ug via INTRAVENOUS

## 2014-10-21 MED ORDER — LACTATED RINGERS IV SOLN
INTRAVENOUS | Status: DC | PRN
  Administered 2014-10-21: 18:00:00 via INTRAVENOUS

## 2014-10-21 MED ORDER — LIDOCAINE HCL (CARDIAC) 20 MG/ML IV SOLN
INTRAVENOUS | Status: AC
  Filled 2014-10-21: qty 5

## 2014-10-21 MED ORDER — FENTANYL CITRATE (PF) 100 MCG/2ML IJ SOLN
25.0000 ug | INTRAMUSCULAR | Status: DC | PRN
  Administered 2014-10-21 (×2): 50 ug via INTRAVENOUS
  Administered 2014-10-21 (×2): 25 ug via INTRAVENOUS

## 2014-10-21 MED ORDER — PROPOFOL 10 MG/ML IV BOLUS
INTRAVENOUS | Status: AC
  Filled 2014-10-21: qty 20

## 2014-10-21 MED ORDER — MIDAZOLAM HCL 2 MG/2ML IJ SOLN
INTRAMUSCULAR | Status: AC
  Filled 2014-10-21: qty 2

## 2014-10-21 MED ORDER — PROMETHAZINE HCL 25 MG/ML IJ SOLN
INTRAMUSCULAR | Status: AC
  Filled 2014-10-21: qty 1

## 2014-10-21 MED ORDER — KETOROLAC TROMETHAMINE 30 MG/ML IJ SOLN
INTRAMUSCULAR | Status: AC
  Filled 2014-10-21: qty 1

## 2014-10-21 MED ORDER — LIDOCAINE HCL 2 % EX GEL
CUTANEOUS | Status: AC
  Filled 2014-10-21: qty 10

## 2014-10-21 MED ORDER — KETOROLAC TROMETHAMINE 30 MG/ML IJ SOLN
30.0000 mg | Freq: Once | INTRAMUSCULAR | Status: AC
  Administered 2014-10-21: 30 mg via INTRAVENOUS

## 2014-10-21 SURGICAL SUPPLY — 18 items
BAG URO CATCHER STRL LF (DRAPE) ×4 IMPLANT
BASKET ZERO TIP NITINOL 2.4FR (BASKET) ×4 IMPLANT
CATH INTERMIT  6FR 70CM (CATHETERS) ×4 IMPLANT
CLOTH BEACON ORANGE TIMEOUT ST (SAFETY) ×4 IMPLANT
FIBER LASER FLEXIVA 1000 (UROLOGICAL SUPPLIES) IMPLANT
FIBER LASER FLEXIVA 200 (UROLOGICAL SUPPLIES) IMPLANT
FIBER LASER FLEXIVA 365 (UROLOGICAL SUPPLIES) ×4 IMPLANT
FIBER LASER FLEXIVA 550 (UROLOGICAL SUPPLIES) IMPLANT
FIBER LASER TRAC TIP (UROLOGICAL SUPPLIES) IMPLANT
GLOVE BIOGEL M STRL SZ7.5 (GLOVE) ×4 IMPLANT
GOWN STRL REUS W/TWL LRG LVL3 (GOWN DISPOSABLE) ×8 IMPLANT
GUIDEWIRE ANG ZIPWIRE 038X150 (WIRE) IMPLANT
GUIDEWIRE STR DUAL SENSOR (WIRE) ×4 IMPLANT
MANIFOLD NEPTUNE II (INSTRUMENTS) ×4 IMPLANT
PACK CYSTO (CUSTOM PROCEDURE TRAY) ×4 IMPLANT
SHEATH ACCESS URETERAL 38CM (SHEATH) IMPLANT
TUBING CONNECTING 10 (TUBING) ×3 IMPLANT
TUBING CONNECTING 10' (TUBING) ×1

## 2014-10-21 NOTE — Op Note (Signed)
Preoperative diagnosis: Left ureteral calculus  Postoperative diagnosis: Left ureteral calculus  Procedure:  1. Cystoscopy 2. Left ureteroscopy and stone removal 3. Ureteroscopic laser lithotripsy 4. Left retrograde pyelography with interpretation  Surgeon: Pryor Curia. M.D.  Anesthesia: General  Complications: None  Intraoperative findings: Left retrograde pyelography demonstrated a filling defect within the distal left ureter consistent with the patient's known calculus without other abnormalities.  EBL: Minimal  Specimens: 1. Left ureteral calculus  Disposition of specimens: [facility] for stone analysis  Indication: Devin Munoz is a 39 y.o. year old patient with urolithiasis and a persistent distal left ureteral stone. After reviewing the management options for treatment, the patient elected to proceed with the above surgical procedure(s). We have discussed the potential benefits and risks of the procedure, side effects of the proposed treatment, the likelihood of the patient achieving the goals of the procedure, and any potential problems that might occur during the procedure or recuperation. Informed consent has been obtained.  Description of procedure:  The patient was taken to the operating room and general anesthesia was induced.  The patient was placed in the dorsal lithotomy position, prepped and draped in the usual sterile fashion, and preoperative antibiotics were administered. A preoperative time-out was performed.   Cystourethroscopy was performed.  The patient's urethra was examined and was normal. The bladder was then systematically examined in its entirety. There was no evidence for any bladder tumors, stones, or other mucosal pathology.    Attention then turned to the left ureteral orifice and a ureteral catheter was used to intubate the ureteral orifice.  Omnipaque contrast was injected through the ureteral catheter and a retrograde  pyelogram was performed with findings as dictated above.  A 0.38 sensor guidewire was then advanced up the left ureter into the renal pelvis under fluoroscopic guidance. The 6 Fr semirigid ureteroscope was then advanced into the ureter next to the guidewire and the calculus was identified.   The stone was then fragmented with the 365 micron holmium laser fiber on a setting of 0.6 J and frequency of 6 Hz.   All stones were then removed from the ureter with a zero tip nitinol basket.  Reinspection of the ureter revealed no remaining visible stones or fragments.   The wire was then backloaded through the cystoscope and a ureteral stent was advance over the wire using Seldinger technique.  The stent was positioned appropriately under fluoroscopic and cystoscopic guidance.  The wire was then removed with an adequate stent curl noted in the renal pelvis as well as in the bladder.  The bladder was then emptied and the procedure ended.  The patient appeared to tolerate the procedure well and without complications.  The patient was able to be awakened and transferred to the recovery unit in satisfactory condition.

## 2014-10-21 NOTE — Interval H&P Note (Signed)
History and Physical Interval Note:  10/21/2014 5:52 PM  Devin Munoz  has presented today for surgery, with the diagnosis of LEFT URETERAL LITHIASIS  The various methods of treatment have been discussed with the patient and family. After consideration of risks, benefits and other options for treatment, the patient has consented to  Procedure(s): CYSTOSCOPY WITH RETROGRADE PYELOGRAM, URETEROSCOPY AND STENT PLACEMENT (Left) CYSTOSCOPY WITH HOLMIUM LASER LITHOTRIPSY (Left) as a surgical intervention .  The patient's history has been reviewed, patient examined, no change in status, stable for surgery.  I have reviewed the patient's chart and labs.  Questions were answered to the patient's satisfaction.     Dhillon Comunale,LES

## 2014-10-21 NOTE — Anesthesia Procedure Notes (Signed)
Procedure Name: LMA Insertion Performed by: Gean Maidens Pre-anesthesia Checklist: Patient identified, Emergency Drugs available, Patient being monitored, Suction available and Timeout performed Patient Re-evaluated:Patient Re-evaluated prior to inductionOxygen Delivery Method: Circle system utilized Preoxygenation: Pre-oxygenation with 100% oxygen Intubation Type: IV induction Ventilation: Mask ventilation without difficulty LMA: LMA inserted LMA Size: 5.0 Number of attempts: 1 Placement Confirmation: positive ETCO2,  CO2 detector and breath sounds checked- equal and bilateral Tube secured with: Tape Dental Injury: Teeth and Oropharynx as per pre-operative assessment

## 2014-10-21 NOTE — Discharge Instructions (Signed)
1. You may see some blood in the urine and may have some burning with urination for 48-72 hours. You also may notice that you have to urinate more frequently or urgently after your procedure which is normal.  °2. You should call should you develop an inability urinate, fever > 101, persistent nausea and vomiting that prevents you from eating or drinking to stay hydrated.  °

## 2014-10-21 NOTE — Addendum Note (Signed)
Addendum  created 10/21/14 1935 by Lauretta Grill, MD   Modules edited: Orders

## 2014-10-21 NOTE — Anesthesia Postprocedure Evaluation (Signed)
  Anesthesia Post-op Note  Patient: Devin Munoz  Procedure(s) Performed: Procedure(s) (LRB): CYSTOSCOPY WITH RETROGRADE PYELOGRAM, URETEROSCOPY  (Left) CYSTOSCOPY WITH HOLMIUM LASER LITHOTRIPSY (Left)  Patient Location: PACU  Anesthesia Type: General  Level of Consciousness: awake and alert   Airway and Oxygen Therapy: Patient Spontanous Breathing  Post-op Pain: mild  Post-op Assessment: Post-op Vital signs reviewed, Patient's Cardiovascular Status Stable, Respiratory Function Stable, Patent Airway and No signs of Nausea or vomiting  Last Vitals:  Filed Vitals:   10/21/14 1900  BP: 123/84  Pulse: 82  Temp:   Resp: 18    Post-op Vital Signs: stable   Complications: No apparent anesthesia complications

## 2014-10-21 NOTE — H&P (Signed)
History of Present Illness He presents for continued management of a left ureteral lithiasis. He notes over the past 5 days with increasing LLQ and flank pain with associated nausea. He also endorses increased frequency and dysuria. He notes not passing any stone or stone like material in his urine. He is off alpha blocker therapy. He has remained afebrile and without gross hematuria. He notes using uribel for minor symptom relief.   Past Medical History Problems  1. History of No acute medical problems  Surgical History Problems  1. History of Surgery Of Male Genitalia Orchiectomy Radical  Current Meds 1. Ibuprofen CAPS;  Therapy: (Recorded:10Dec2015) to Recorded 2. PredniSONE 50 MG Oral Tablet; TAKE 50 MG As Directed Please take 13 hours, 7 hours,  and 1 hour before scheduled test and then 5 hours after test;  Therapy: 30Jun2016 to (Last Rx:30Jun2016)  Requested for: 30Jun2016 Ordered 3. Rapaflo 8 MG Oral Capsule;  Therapy: (Recorded:21Jun2016) to Recorded  Allergies Medication  1. Aspirin TABS 2. Penicillins Non-Medication  3. Shellfish  Family History Problems  1. Family history of colon cancer (Z80.0) : Maternal Grandfather 2. Family history of diabetes mellitus (Z83.3) : Grandmother 3. Family history of hypertension (Z82.49) : Father 4. Family history of kidney stones (Z84.1) : Father 5. Family history of prostate cancer (Z80.42) : Maternal Grandfather  Social History Problems  1. Denied: History of Alcohol use 2. Four children 3. Married 4. Never a smoker 5. Occupation   Company secretary  Review of Systems Genitourinary, constitutional, skin, eye, otolaryngeal, hematologic/lymphatic, cardiovascular, pulmonary, endocrine, musculoskeletal, gastrointestinal, neurological and psychiatric system(s) were reviewed and pertinent findings if present are noted and are otherwise negative.  Genitourinary: urinary frequency and dysuria, but no hematuria.  Gastrointestinal: nausea,  flank pain and abdominal pain.    Vitals Vital Signs [Data Includes: Last 1 Day]  Recorded: [id number] 02:41PM  Height: 6 ft 2 in Weight: 305 lb  BMI Calculated: 39.16 BSA Calculated: 2.6 Blood Pressure: 149 / 93 Heart Rate: 98  Physical Exam Constitutional: Well nourished and well developed . No acute distress.  ENT:. The ears and nose are normal in appearance.  Neck: The appearance of the neck is normal and no neck mass is present.  Pulmonary: No respiratory distress and normal respiratory rhythm and effort.  Cardiovascular: Heart rate and rhythm are normal . No peripheral edema.  Abdomen: The abdomen is soft and nontender. No masses are palpated. No CVA tenderness. No hernias are palpable. No hepatosplenomegaly noted.  Lymphatics: The femoral and inguinal nodes are not enlarged or tender.  Skin: Normal skin turgor, no visible rash and no visible skin lesions.  Neuro/Psych:. Mood and affect are appropriate.    Results/Data Urine [Data Includes: Last 1 Day]   [id number]  COLOR GREEN   APPEARANCE CLEAR   SPECIFIC GRAVITY 1.015   pH 6.0   GLUCOSE NEG mg/dL  BILIRUBIN NEG   KETONE NEG mg/dL  BLOOD SMALL   PROTEIN NEG mg/dL  UROBILINOGEN 0.2 mg/dL  NITRITE NEG   LEUKOCYTE ESTERASE NEG   SQUAMOUS EPITHELIAL/HPF NONE SEEN   WBC NONE SEEN WBC/hpf  RBC 7-10 RBC/hpf  BACTERIA NONE SEEN   CRYSTALS NONE SEEN   CASTS NONE SEEN    The following images/tracing/specimen were independently visualized: Marland Kitchen KUB: Bowel gas pattern is grossly normal appearing. The bilateral renal shadows are visualized and no lithiasis is noted. The expected track of the right ureter is clear. The expected track of the left ureter is clear. The previous noted lithiasis in  the distal left ureter is now noted to be medial and superior from last imaging and is partially obscured by bowel gas. Several pelvic phlebolith noted.  The following clinical lab reports were reviewed:  UA with 7-10 rbc/hpf. Sent for  culture.    Assessment Assessed  1. Calculus of left ureter (N20.1) 2. Renal colic on left side (E31)  Plan Calculus of left ureter  1. Start: Ketorolac Tromethamine 10 MG Oral Tablet; TAKE 1 TABLET 3 TIMES DAILY AS  NEEDED FOR PAIN 2. Start: Promethazine HCl - 25 MG Oral Tablet; TAKE 1 TABLET EVERY 8 HOURS AS  NEEDED FOR NAUSEA AND VOMITING 3. Start: TraMADol HCl - 50 MG Oral Tablet; TAKE 1 OR 2 TABLETS BY MOUTH EVERY 4 TO  6 HOURS AS NEEDED 4. Follow-up MD Office  Follow-up 3-4 weeks w/Dr. Alinda Money per Dr. Alinda Money to discuss ?  cystourethroscopy if stone not passed  Status: Canceled - Date of Service 5. Follow-up Schedule Surgery Office  Follow-up  Status: Complete  Done: 18Jul2016 6. KUB; Status:Canceled - Date of Service;  7. KUB; Status:Complete;   Done: 54MGQ6761 03:05PM 8. URINE CULTURE; Status:In Progress - Specimen/Data Collected;   Done: [id number] Health Maintenance  9. UA With REFLEX; [Do Not Release]; Status:Complete;   Done: 24PYK9983 02:31PM  Discussion/Summary We discussed ureteroscopic stone manipulation with basketing and laser-lithotripsy in detail. We discussed risks including bleeding, infection, damage to kidney / ureter bladder, rarely loss of kidney. We discussed anesthetic risks and rare but serious surgical complications including DVT, PE, MI, and mortality. We specifically addressed that in 5-10% of cases a staged approach is required with stenting followed by re-attempt ureteroscopy if anatomy unfavorable. Given his present condition, in my opinion this is the best way to proceed. The patient voiced understanding and wishes to proceed.     Urine c/s today. I will prescribe anti-inflammatories and tramodal for continued pain management; phenergan for nausea. I gave samples of rapaflo with correct useage instructions. He will be contacted about scheduling his procedure. He understands AUS/ED f/u information regarding fever, uncontrollable pain, gross  hematuria/urinary retention in the presence of new or worsening LUTS.     CC Dr Levada Schilling Electronically signed by : Jiles Crocker, ANP-C; Oct 18 2014 10:29AM EST

## 2014-10-21 NOTE — Transfer of Care (Signed)
Immediate Anesthesia Transfer of Care Note  Patient: Devin Munoz  Procedure(s) Performed: Procedure(s): CYSTOSCOPY WITH RETROGRADE PYELOGRAM, URETEROSCOPY  (Left) CYSTOSCOPY WITH HOLMIUM LASER LITHOTRIPSY (Left)  Patient Location: PACU  Anesthesia Type:General  Level of Consciousness: sedated, patient cooperative and responds to stimulation  Airway & Oxygen Therapy: Patient Spontanous Breathing and Patient connected to face mask oxygen  Post-op Assessment: Report given to RN and Post -op Vital signs reviewed and stable  Post vital signs: Reviewed and stable  Last Vitals:  Filed Vitals:   10/21/14 1515  BP: 125/73  Pulse: 94  Temp: 36.6 C  Resp: 16    Complications: No apparent anesthesia complications

## 2014-10-24 ENCOUNTER — Encounter (HOSPITAL_COMMUNITY): Payer: Self-pay | Admitting: Urology

## 2014-11-17 ENCOUNTER — Ambulatory Visit (HOSPITAL_COMMUNITY)
Admission: RE | Admit: 2014-11-17 | Discharge: 2014-11-17 | Disposition: A | Discharge status: Home / Self Care | Payer: Self-pay | Source: Ambulatory Visit | Attending: Urology | Admitting: Urology

## 2014-11-17 ENCOUNTER — Encounter (HOSPITAL_COMMUNITY): Payer: Self-pay

## 2014-11-17 ENCOUNTER — Other Ambulatory Visit (HOSPITAL_COMMUNITY): Payer: Self-pay | Admitting: Urology

## 2014-11-17 DIAGNOSIS — C6290 Malignant neoplasm of unspecified testis, unspecified whether descended or undescended: Secondary | ICD-10-CM

## 2014-11-17 DIAGNOSIS — Z9079 Acquired absence of other genital organ(s): Secondary | ICD-10-CM | POA: Insufficient documentation

## 2014-11-17 DIAGNOSIS — R590 Localized enlarged lymph nodes: Secondary | ICD-10-CM | POA: Insufficient documentation

## 2014-11-17 DIAGNOSIS — N400 Enlarged prostate without lower urinary tract symptoms: Secondary | ICD-10-CM | POA: Insufficient documentation

## 2014-11-17 MED ORDER — IOHEXOL 300 MG/ML  SOLN
100.0000 mL | Freq: Once | INTRAMUSCULAR | Status: AC | PRN
  Administered 2014-11-17: 100 mL via INTRAVENOUS

## 2014-11-30 ENCOUNTER — Encounter (HOSPITAL_COMMUNITY): Payer: Self-pay

## 2014-11-30 ENCOUNTER — Other Ambulatory Visit (HOSPITAL_COMMUNITY): Payer: Self-pay | Admitting: Urology

## 2014-11-30 ENCOUNTER — Ambulatory Visit (HOSPITAL_COMMUNITY)
Admission: RE | Admit: 2014-11-30 | Discharge: 2014-11-30 | Disposition: A | Discharge status: Home / Self Care | Payer: Self-pay | Source: Ambulatory Visit | Attending: Urology | Admitting: Urology

## 2014-11-30 DIAGNOSIS — C6291 Malignant neoplasm of right testis, unspecified whether descended or undescended: Secondary | ICD-10-CM

## 2014-11-30 DIAGNOSIS — Z9079 Acquired absence of other genital organ(s): Secondary | ICD-10-CM | POA: Insufficient documentation

## 2014-11-30 DIAGNOSIS — Z08 Encounter for follow-up examination after completed treatment for malignant neoplasm: Secondary | ICD-10-CM | POA: Insufficient documentation

## 2014-11-30 MED ORDER — IOHEXOL 300 MG/ML  SOLN
75.0000 mL | Freq: Once | INTRAMUSCULAR | Status: AC | PRN
  Administered 2014-11-30: 75 mL via INTRAVENOUS

## 2014-12-12 ENCOUNTER — Ambulatory Visit
Admission: RE | Admit: 2014-12-12 | Discharge: 2014-12-12 | Disposition: A | Discharge status: Home / Self Care | Payer: Self-pay | Source: Ambulatory Visit | Attending: Radiation Oncology | Admitting: Radiation Oncology

## 2014-12-12 ENCOUNTER — Encounter: Payer: Self-pay | Admitting: Radiation Oncology

## 2014-12-12 ENCOUNTER — Telehealth: Payer: Self-pay | Admitting: Radiation Oncology

## 2014-12-12 DIAGNOSIS — C6291 Malignant neoplasm of right testis, unspecified whether descended or undescended: Secondary | ICD-10-CM | POA: Insufficient documentation

## 2014-12-12 NOTE — Telephone Encounter (Signed)
Patient has not shown for consultation with Dr. Tammi Klippel. Phoned patient's home. Spoke with wife, Almyra Free. She explains that [facility] was to cancel this appointment because her husband has elected to see a different physician. She apologized for the confusion. Encouraged to call with future needs. She verbalized understanding.

## 2014-12-12 NOTE — Progress Notes (Signed)
See progress note for additional details. Patient did not show for this appointment.

## 2014-12-12 NOTE — Progress Notes (Signed)
  Radiation Oncology         [phone] ________________________________  Name: Devin Munoz MRN: [id number]  Date: 12/12/2014  DOB: 22-Jul-1975  Chart Note:  I reviewed this patient's chart and imaging in our multidisciplinary genitourinary tumor board on Friday 12/09/14.  He had right orchiectomy for a 2.1 cm seminoma in 11/15.  He appropriately elected surveillance and follow-up CT showed a retrocaval node increased from 7 mm to 16 mm.  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  We discussed treatment options and reviewed the NCCN guidelines.  In this setting, the patient would be eligible for paraaortic radiotherapy to 30 Gy or multi-agent chemotherapy (EP for 4 cycles or BEP for 3 cycles).  The NCCN guidelines indicate that radiotherapy is the preferred choice of those two options.  The [place] Tumor Board was supportive of these options.  The preference for radiotherapy was recently supported by a report from the [place] National Cancer Database showing that for clinical stage IIA, 5-year OS was 99.4% for RT versus 91.2% for chemotherapy (log-rank P < .01).  Of course, the reason that there are options is that some patients may be more suitable for chemotherapy.  In this patient, I have some concerns about how his body habitus may influence the quality of radiotherapy.  Unfortunately, the patient elected not to present for his scheduled consultation with me today.  I would be more than happy to be a part of his multidisciplinary care team.   Sheral Apley. Tammi Klippel, M.D.   -----------------------------------------------   Reference: Simonne Martinet CC, Star Age, Hallemeier CL, Launa Grill, S[place]haw H, Jemal A, [place]. Management and outcomes of clinical stage IIA/B seminoma: Results from the [facility] [phone]. [place] 2016 May 8. pii: [id number]. doi: 10.1016/j.prro.2016.05.002. [Epub ahead of print] PubMed PMID: [id number]

## 2014-12-12 NOTE — Progress Notes (Signed)
GU Location of Tumor / Histology: seminoma of right testis  Nilton Lave presented November 2015 for evaluation of painful right testicular mass     Past/Anticipated interventions by urology, if any: radical genitalia orchiectomy and left ureteroscopic laser lithotripsy  Past/Anticipated interventions by medical oncology, if any: no  Weight changes, if any: no  Bowel/Bladder complaints, if any: prior to lithotripsy he c/o urinary frequency and urgency   Nausea/Vomiting, if any: no  Pain issues, if any:  no  SAFETY ISSUES:  Prior radiation? no  Pacemaker/ICD? no  Possible current pregnancy? no  Is the patient on methotrexate? no  Current Complaints / other details:  39 year male. 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

## 2014-12-13 ENCOUNTER — Encounter: Payer: Self-pay | Admitting: Hematology & Oncology

## 2014-12-13 ENCOUNTER — Ambulatory Visit (HOSPITAL_BASED_OUTPATIENT_CLINIC_OR_DEPARTMENT_OTHER): Payer: Self-pay | Admitting: Hematology & Oncology

## 2014-12-13 ENCOUNTER — Other Ambulatory Visit (HOSPITAL_BASED_OUTPATIENT_CLINIC_OR_DEPARTMENT_OTHER): Payer: Self-pay

## 2014-12-13 ENCOUNTER — Ambulatory Visit: Payer: Self-pay

## 2014-12-13 VITALS — BP 135/94 | HR 105 | Temp 98.0°F | Resp 16 | Ht 73.0 in | Wt 322.0 lb

## 2014-12-13 DIAGNOSIS — C6291 Malignant neoplasm of right testis, unspecified whether descended or undescended: Secondary | ICD-10-CM

## 2014-12-13 LAB — CBC WITH DIFFERENTIAL (CANCER CENTER ONLY)
BASO#: 0 10*3/uL (ref 0.0–0.2)
BASO%: 0.1 % (ref 0.0–2.0)
EOS ABS: 0.1 10*3/uL (ref 0.0–0.5)
EOS%: 1.8 % (ref 0.0–7.0)
HEMATOCRIT: 44.8 % (ref 38.7–49.9)
HEMOGLOBIN: 14.8 g/dL (ref 13.0–17.1)
LYMPH#: 2.3 10*3/uL (ref 0.9–3.3)
LYMPH%: 28.8 % (ref 14.0–48.0)
MCH: 29.2 pg (ref 28.0–33.4)
MCHC: 33 g/dL (ref 32.0–35.9)
MCV: 88 fL (ref 82–98)
MONO#: 0.6 10*3/uL (ref 0.1–0.9)
MONO%: 8 % (ref 0.0–13.0)
NEUT#: 4.9 10*3/uL (ref 1.5–6.5)
NEUT%: 61.3 % (ref 40.0–80.0)
PLATELETS: 231 10*3/uL (ref 145–400)
RBC: 5.07 10*6/uL (ref 4.20–5.70)
RDW: 13.2 % (ref 11.1–15.7)
WBC: 7.9 10*3/uL (ref 4.0–10.0)

## 2014-12-13 NOTE — Progress Notes (Signed)
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 OTHER    New Patient  : My cancer might be back.  HPI: Devin Munoz is a very nice 39 year old white gentleman. 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  He notices some testicular swelling in the right testicle that was associated with no pain back in October last year. He ultimately underwent an ultrasound which showed a mass in the right testicle. He then had an orchiectomy done. This is an on November 15. The pathology report ([id number]) showed a 2.1cm seminoma. This well-appearing be a pure seminoma. He had no angiolymphatic invasion. There was no positive margins.  Earlier this year, he had problems with kidney stones the right kidney. He had a procedure done to extract the kidney stone. This was done in July.  He then had a CT of the abdomen and pelvis in August. This showed a 1.6 x 1.6 cm aortocaval node. Everything else looked fine. A CT of the chest was done which was negative. He had a slightly elevated LDH.  Because of this, there was concern regarding recurrence.  He feels well. He has no abdominal pain. He has no fever. He's had no cough or shortness of breath.  He is coming referred to the [facility] for an evaluation.     Past Medical History  Diagnosis Date  . Testicular mass     RIGHT  . Frequency of urination   . Urgency of urination   . At risk for sleep apnea     STOP-BANG= 5     SENT TO PCP 02-14-2014  . History of kidney stones     past hx-was able to pass  . Cold-induced asthma     CURRENTLY NO INHALER"cold weatherrelated"  . Cancer     right testicle  :  Past Surgical History  Procedure Laterality Date  . Orchiectomy Right 02/15/2014    Procedure: INGUINAL RADICAL ORCHIECTOMY;  Surgeon: Arvil Persons, MD;  Location: [facility];  Service: Urology;  Laterality:  Right;-no prosthesis-no chemotherapy  . Cystoscopy with retrograde pyelogram, ureteroscopy and stent placement Left 10/21/2014    Procedure: CYSTOSCOPY WITH RETROGRADE PYELOGRAM, URETEROSCOPY ;  Surgeon: Raynelle Bring, MD;  Location: [facility];  Service: Urology;  Laterality: Left;  . Holmium laser application Left 07/26/8339    Procedure: CYSTOSCOPY WITH HOLMIUM LASER LITHOTRIPSY;  Surgeon: Raynelle Bring, MD;  Location: [facility];  Service: Urology;  Laterality: Left;  :  No current outpatient prescriptions on file.:  :  Allergies  Allergen Reactions  . Penicillins Anaphylaxis, Hives and Swelling  . Shellfish Allergy Hives    ALL TYPES  . Sulfa Antibiotics Hives  . Asa [Aspirin] Rash  :  No family history on file.:  Social History   Social History  . Marital Status: Married    Spouse Name: N/A  . Number of Children: N/A  . Years of Education: N/A   Occupational History  . Not on file.   Social History Main Topics  . Smoking status: Never Smoker   . Smokeless tobacco: Never Used  . Alcohol Use: No  . Drug Use: No  . Sexual Activity: Yes   Other Topics Concern  . Not on file   Social History Narrative  :  Pertinent items are noted in HPI.  Exam: @IPVITALS @ Obese white male in no  obvious distress. Vital signs show temperature of 98. Pulse 105. Blood pressure 135/94. Weight is 322 pounds. Head and neck exam shows no ocular or oral lesions. There are no palpable cervical or supraclavicular lymph nodes. Lungs are clear. Cardiac exam regular rate and rhythm with no murmurs, rubs or bruits. Abdomen is soft. He has good bowel sounds. There is no fluid wave. He has a well-healed right inguinal orchiectomy scar. There is no palpable liver or spleen tip. Back exam shows no tenderness over the spine ribs or hips. Extremities shows no clubbing, cyanosis or edema. Skin exam shows no rashes, ecchymoses or petechia. Breast exam shows no gynecomastia.    Recent Labs  12/13/14 1138  WBC  7.9  HGB 14.8  HCT 44.8  PLT 231   No results for input(s): NA, K, CL, CO2, GLUCOSE, BUN, CREATININE, CALCIUM in the last 72 hours.  Blood smear review:none  Pathology: See above    Assessment and Plan:  Devin Munoz is a 40 year old white gentleman. He had a stage I seminoma. This really looks like it had a good prognostic features.  I would just be surprised that he would have recurrence of this. I think the lymph node that is enlarged might be from the recent kidney stone surgery that he had done.  We'll see what his tumor marker show. I think if they are elevated, then we probably are looking at recurrent disease. Again, I would think that this should be highly unusual given the early stage that he had and the fact that there is no angiolymphatic invasion.  I spent a good 45 minutes with him. I reviewed the CT scan.  Because of the high success rate with chemotherapy for recurrent disease, I think we have the flexibility of watching him right now.  I probably would repeat his scan in 6 weeks. I think this will be very helpful.  We will see what his tumor markers show today.  He is okay with this recommendation. He agrees with it. He is busy with his church and I do not want to interrupt this if we do not have to.

## 2014-12-14 ENCOUNTER — Other Ambulatory Visit: Payer: Self-pay | Admitting: Lab

## 2014-12-14 DIAGNOSIS — C6291 Malignant neoplasm of right testis, unspecified whether descended or undescended: Secondary | ICD-10-CM

## 2014-12-14 LAB — LACTATE DEHYDROGENASE: LDH: 175 U/L (ref 94–250)

## 2014-12-14 NOTE — Addendum Note (Signed)
Addended by: Burney Gauze R on: 12/14/2014 11:46 AM   Modules accepted: Orders

## 2014-12-16 LAB — COMPREHENSIVE METABOLIC PANEL
ALT: 19 U/L (ref 9–46)
AST: 16 U/L (ref 10–40)
Albumin: 4.4 g/dL (ref 3.6–5.1)
Alkaline Phosphatase: 102 U/L (ref 40–115)
BUN: 13 mg/dL (ref 7–25)
CALCIUM: 9.1 mg/dL (ref 8.6–10.3)
CHLORIDE: 105 mmol/L (ref 98–110)
CO2: 23 mmol/L (ref 20–31)
Creatinine, Ser: 0.9 mg/dL (ref 0.60–1.35)
Glucose, Bld: 115 mg/dL — ABNORMAL HIGH (ref 65–99)
Potassium: 4.4 mmol/L (ref 3.5–5.3)
Sodium: 140 mmol/L (ref 135–146)
Total Bilirubin: 0.6 mg/dL (ref 0.2–1.2)
Total Protein: 7.2 g/dL (ref 6.1–8.1)

## 2014-12-16 LAB — BETA HCG QUANT (REF LAB): Beta hCG, Tumor Marker: <2 m[IU]/mL (ref <5.0)

## 2014-12-16 LAB — AFP TUMOR MARKER: AFP-Tumor Marker: 4.2 ng/mL (ref <6.1)

## 2014-12-19 ENCOUNTER — Other Ambulatory Visit: Payer: Self-pay | Admitting: Hematology & Oncology

## 2014-12-19 ENCOUNTER — Telehealth: Payer: Self-pay

## 2014-12-19 DIAGNOSIS — C6291 Malignant neoplasm of right testis, unspecified whether descended or undescended: Secondary | ICD-10-CM

## 2014-12-19 NOTE — Telephone Encounter (Addendum)
-----   Message from [name], MD sent at 12/19/2014  6:44 AM EDT ----- Call - ALL labs look great!!  NO elevated tumor markers!!!  We can watch and repeat the scan in 6 weeks!! I just do not think that the cancer has come back!!!  Devin Munoz  Received call from pt requesting lab results. Above message provided to pt who verbalizes understanding and appreciation. dph

## 2015-02-06 ENCOUNTER — Ambulatory Visit (HOSPITAL_BASED_OUTPATIENT_CLINIC_OR_DEPARTMENT_OTHER): Payer: Self-pay | Admitting: Hematology & Oncology

## 2015-02-06 ENCOUNTER — Ambulatory Visit (HOSPITAL_BASED_OUTPATIENT_CLINIC_OR_DEPARTMENT_OTHER)
Admission: RE | Admit: 2015-02-06 | Discharge: 2015-02-06 | Disposition: A | Discharge status: Home / Self Care | Payer: Self-pay | Source: Ambulatory Visit | Attending: Hematology & Oncology | Admitting: Hematology & Oncology

## 2015-02-06 ENCOUNTER — Other Ambulatory Visit (HOSPITAL_BASED_OUTPATIENT_CLINIC_OR_DEPARTMENT_OTHER): Payer: Self-pay

## 2015-02-06 ENCOUNTER — Encounter: Payer: Self-pay | Admitting: Hematology & Oncology

## 2015-02-06 VITALS — BP 153/84 | HR 78 | Temp 97.9°F | Resp 16 | Ht 73.0 in | Wt 325.0 lb

## 2015-02-06 DIAGNOSIS — Z9079 Acquired absence of other genital organ(s): Secondary | ICD-10-CM | POA: Insufficient documentation

## 2015-02-06 DIAGNOSIS — C6291 Malignant neoplasm of right testis, unspecified whether descended or undescended: Secondary | ICD-10-CM | POA: Insufficient documentation

## 2015-02-06 DIAGNOSIS — R599 Enlarged lymph nodes, unspecified: Secondary | ICD-10-CM | POA: Insufficient documentation

## 2015-02-06 LAB — COMPREHENSIVE METABOLIC PANEL (CC13)
ALBUMIN: 3.9 g/dL (ref 3.5–5.0)
ALK PHOS: 102 U/L (ref 40–150)
ALT: 19 U/L (ref 0–55)
ANION GAP: 9 meq/L (ref 3–11)
AST: 16 U/L (ref 5–34)
BILIRUBIN TOTAL: 0.66 mg/dL (ref 0.20–1.20)
BUN: 11.9 mg/dL (ref 7.0–26.0)
CALCIUM: 9.1 mg/dL (ref 8.4–10.4)
CHLORIDE: 106 meq/L (ref 98–109)
CO2: 25 mEq/L (ref 22–29)
CREATININE: 0.8 mg/dL (ref 0.7–1.3)
EGFR: >90 mL/min/{1.73_m2} (ref >90)
Glucose: 89 mg/dl (ref 70–140)
Potassium: 4.3 mEq/L (ref 3.5–5.1)
Sodium: 139 mEq/L (ref 136–145)
TOTAL PROTEIN: 7 g/dL (ref 6.4–8.3)

## 2015-02-06 LAB — LACTATE DEHYDROGENASE (CC13): LDH: 177 U/L (ref 125–245)

## 2015-02-06 LAB — CBC WITH DIFFERENTIAL (CANCER CENTER ONLY)
BASO#: 0 10*3/uL (ref 0.0–0.2)
BASO%: 0.5 % (ref 0.0–2.0)
EOS%: 2.1 % (ref 0.0–7.0)
Eosinophils Absolute: 0.1 10*3/uL (ref 0.0–0.5)
HCT: 43.8 % (ref 38.7–49.9)
HGB: 14.5 g/dL (ref 13.0–17.1)
LYMPH#: 2.1 10*3/uL (ref 0.9–3.3)
LYMPH%: 33.9 % (ref 14.0–48.0)
MCH: 29.1 pg (ref 28.0–33.4)
MCHC: 33.1 g/dL (ref 32.0–35.9)
MCV: 88 fL (ref 82–98)
MONO#: 0.6 10*3/uL (ref 0.1–0.9)
MONO%: 9 % (ref 0.0–13.0)
NEUT#: 3.4 10*3/uL (ref 1.5–6.5)
NEUT%: 54.5 % (ref 40.0–80.0)
PLATELETS: 216 10*3/uL (ref 145–400)
RBC: 4.98 10*6/uL (ref 4.20–5.70)
RDW: 12.9 % (ref 11.1–15.7)
WBC: 6.1 10*3/uL (ref 4.0–10.0)

## 2015-02-06 MED ORDER — IOHEXOL 300 MG/ML  SOLN
100.0000 mL | Freq: Once | INTRAMUSCULAR | Status: AC | PRN
  Administered 2015-02-06: 100 mL via INTRAVENOUS

## 2015-02-06 NOTE — Progress Notes (Signed)
Hematology and Oncology Follow Up Visit  Devin Munoz [id number] 08-May-1975 39 y.o. 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 I first saw him back in September. At that point in time, he was told at his physical cancer come back. He had normal markers. He had a CT scan which showed a 1.6 x 1.6 and meeter aortocaval node.  I went ahead and repeated his CAT scan today. The CAT scan showed a stable aortocaval lymph node measuring 1. 501.6 cm. There is a second lymph node measuring 1.3 x 1.0 cm. Everything else looked okay.  He feels well. He's having low bit of low back discomfort.  He's had no change in bowel or bladder habits. He's had no bleeding. He's had no nausea or vomiting.  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  His wife comes with them. They have 4 boys. As such, she is quite busy.  Of note, the last time that we saw him, his tumor markers were normal.  Overall, his performance status is ECOG 0.  Medications: No current outpatient prescriptions on file.  Allergies:  Allergies  Allergen Reactions  . Penicillins Anaphylaxis, Hives and Swelling  . Shellfish Allergy Hives    ALL TYPES  . Sulfa Antibiotics Hives  . Asa [Aspirin] Rash    Past Medical History, Surgical history, Social history, and Family History were reviewed and updated.  Review of Systems: As above  Physical Exam:  height is 6\' 1"  (1.854 m) and weight is 325 lb (147.419 kg). His oral temperature is 97.9 F (36.6 C). His blood pressure is 153/84 and his pulse is 78. His respiration is 16.   Wt Readings from Last 3 Encounters:  02/06/15 325 lb (147.419 kg)  12/13/14 322 lb (146.058 kg)  10/21/14 310 lb 9 oz (140.87 kg)     Obese white gentleman in no obvious distress. Head and neck exam shows no ocular or oral lesions.  There are no palpable cervical or supraclavicular lymph nodes. Lungs are clear. Cardiac exam regular and rhythm with a normal S1 and S2. He has no murmurs, rubs or bruits. Abdomen is soft. Has good bowel sounds. He has a right inguinal orchiectomy scar. There is no guarding or rebound tenderness. There is no palpable liver or spleen tip. Back exam shows no tenderness over the spine, ribs or hips. Extremity shows no clubbing, cyanosis or edema. Has good range motion of his joints. Skin exam shows no rashes, ecchymoses or petechia. Neurological exam shows no focal neurological deficits.  Lab Results  Component Value Date   WBC 6.1 02/06/2015   HGB 14.5 02/06/2015   HCT 43.8 02/06/2015   MCV 88 02/06/2015   PLT 216 02/06/2015     Chemistry      Component Value Date/Time   NA 140 12/13/2014 1137   K 4.4 12/13/2014 1137   CL 105 12/13/2014 1137   CO2 23 12/13/2014 1137   BUN 13 12/13/2014 1137   CREATININE 0.90 12/13/2014 1137      Component Value Date/Time   CALCIUM 9.1 12/13/2014 1137   ALKPHOS 102 12/13/2014 1137   AST 16 12/13/2014 1137   ALT 19 12/13/2014 1137   BILITOT 0.6 12/13/2014 1137         Impression and Plan: Devin Munoz  is a 39 year old white male. He has a stage I seminoma of the right testicle.  This stability of the lymph nodes is reassuring to me.  I think we probably need to repeat another scan on him in 3-4 months. I think this would be very reasonable.  He can always come back to see [place] before if he has any problems.  We will see how his tumor markers are.   [name], MD 11/7/201611:37 AM

## 2015-02-08 LAB — AFP TUMOR MARKER: AFP-Tumor Marker: 3.7 ng/mL (ref <6.1)

## 2015-02-08 LAB — BETA HCG QUANT (REF LAB): Beta hCG, Tumor Marker: <2 m[IU]/mL (ref <5.0)

## 2015-02-09 ENCOUNTER — Telehealth: Payer: Self-pay | Admitting: *Deleted

## 2015-02-09 NOTE — Telephone Encounter (Addendum)
Patient aware of results  ----- Message from [name], MD sent at 02/08/2015  6:28 PM EST ----- Call - tumor markers are totally normal!!! pete

## 2015-06-05 ENCOUNTER — Ambulatory Visit (HOSPITAL_BASED_OUTPATIENT_CLINIC_OR_DEPARTMENT_OTHER)
Admission: RE | Admit: 2015-06-05 | Discharge: 2015-06-05 | Disposition: A | Discharge status: Home / Self Care | Payer: Self-pay | Source: Ambulatory Visit | Attending: Hematology & Oncology | Admitting: Hematology & Oncology

## 2015-06-05 ENCOUNTER — Encounter (HOSPITAL_BASED_OUTPATIENT_CLINIC_OR_DEPARTMENT_OTHER): Payer: Self-pay

## 2015-06-05 ENCOUNTER — Encounter: Payer: Self-pay | Admitting: Hematology & Oncology

## 2015-06-05 ENCOUNTER — Ambulatory Visit (HOSPITAL_BASED_OUTPATIENT_CLINIC_OR_DEPARTMENT_OTHER): Payer: Self-pay | Admitting: Hematology & Oncology

## 2015-06-05 ENCOUNTER — Other Ambulatory Visit (HOSPITAL_BASED_OUTPATIENT_CLINIC_OR_DEPARTMENT_OTHER): Payer: Self-pay

## 2015-06-05 VITALS — BP 149/82 | HR 75 | Temp 98.1°F | Resp 16 | Ht 73.0 in | Wt 321.0 lb

## 2015-06-05 DIAGNOSIS — R590 Localized enlarged lymph nodes: Secondary | ICD-10-CM | POA: Insufficient documentation

## 2015-06-05 DIAGNOSIS — C6291 Malignant neoplasm of right testis, unspecified whether descended or undescended: Secondary | ICD-10-CM

## 2015-06-05 DIAGNOSIS — K769 Liver disease, unspecified: Secondary | ICD-10-CM | POA: Insufficient documentation

## 2015-06-05 LAB — CMP (CANCER CENTER ONLY)
ALBUMIN: 4 g/dL (ref 3.3–5.5)
ALT(SGPT): 27 U/L (ref 10–47)
AST: 24 U/L (ref 11–38)
Alkaline Phosphatase: 97 U/L — ABNORMAL HIGH (ref 26–84)
BUN, Bld: 8 mg/dL (ref 7–22)
CHLORIDE: 105 meq/L (ref 98–108)
CO2: 29 meq/L (ref 18–33)
Calcium: 9.1 mg/dL (ref 8.0–10.3)
Creat: 1.1 mg/dl (ref 0.6–1.2)
Glucose, Bld: 95 mg/dL (ref 73–118)
POTASSIUM: 4 meq/L (ref 3.3–4.7)
SODIUM: 142 meq/L (ref 128–145)
TOTAL PROTEIN: 7.5 g/dL (ref 6.4–8.1)
Total Bilirubin: 0.8 mg/dl (ref 0.20–1.60)

## 2015-06-05 LAB — CBC WITH DIFFERENTIAL (CANCER CENTER ONLY)
BASO#: 0 10*3/uL (ref 0.0–0.2)
BASO%: 0.4 % (ref 0.0–2.0)
EOS ABS: 0.2 10*3/uL (ref 0.0–0.5)
EOS%: 2.9 % (ref 0.0–7.0)
HEMATOCRIT: 43.8 % (ref 38.7–49.9)
HEMOGLOBIN: 14.7 g/dL (ref 13.0–17.1)
LYMPH#: 1.7 10*3/uL (ref 0.9–3.3)
LYMPH%: 31.5 % (ref 14.0–48.0)
MCH: 29.2 pg (ref 28.0–33.4)
MCHC: 33.6 g/dL (ref 32.0–35.9)
MCV: 87 fL (ref 82–98)
MONO#: 0.5 10*3/uL (ref 0.1–0.9)
MONO%: 9.7 % (ref 0.0–13.0)
NEUT%: 55.5 % (ref 40.0–80.0)
NEUTROS ABS: 3.1 10*3/uL (ref 1.5–6.5)
Platelets: 225 10*3/uL (ref 145–400)
RBC: 5.03 10*6/uL (ref 4.20–5.70)
RDW: 13.1 % (ref 11.1–15.7)
WBC: 5.5 10*3/uL (ref 4.0–10.0)

## 2015-06-05 LAB — LACTATE DEHYDROGENASE: LDH: 170 U/L (ref 125–245)

## 2015-06-05 MED ORDER — IOHEXOL 300 MG/ML  SOLN
100.0000 mL | Freq: Once | INTRAMUSCULAR | Status: AC | PRN
  Administered 2015-06-05: 100 mL via INTRAVENOUS

## 2015-06-05 NOTE — Progress Notes (Signed)
Hematology and Oncology Follow Up Visit  Devin Munoz [id number] 04/06/1975 40 y.o. 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 He is doing quite well. He's tried exercise a little bit more. He now is on the  [employer] with his wife. He will do this for 40 days.  We did go ahead and get a CT scan. The CT scan did not show any evidence of lymphadenopathy that was pathologic. The periaortic nodes that he had gradually smaller. No new lymph nodes were noted. He had a couple indeterminant hepatic lesions.  His last tumor markers were both normal.  He's had some discomfort in the left lower quadrant. I don't of this might be diverticulosis. He has had a hernia from what he says. Of note, the last time that we saw him, his tumor markers were normal.  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  He's had no cough. He's had no nausea or vomiting. He's had no rashes. He's had no fever. He's had no process with infections.  Overall, his performance status is ECOG 0.  Medications: No current outpatient prescriptions on file.  Allergies:  Allergies  Allergen Reactions  . Penicillins Anaphylaxis, Hives and Swelling  . Shellfish Allergy Hives    ALL TYPES  . Sulfa Antibiotics Hives  . Asa [Aspirin] Rash    Past Medical History, Surgical history, Social history, and Family History were reviewed and updated.  Review of Systems: As above  Physical Exam:  height is 6\' 1"  (1.854 m) and weight is 321 lb (145.605 kg). His oral temperature is 98.1 F (36.7 C). His blood pressure is 149/82 and his pulse is 75. His respiration is 16.   Wt Readings from Last 3 Encounters:  06/05/15 321 lb (145.605 kg)  02/06/15 325 lb (147.419 kg)  12/13/14 322 lb (146.058 kg)     Obese white gentleman in no obvious distress. Head  and neck exam shows no ocular or oral lesions. There are no palpable cervical or supraclavicular lymph nodes. Lungs are clear. Cardiac exam regular and rhythm with a normal S1 and S2. He has no murmurs, rubs or bruits. Abdomen is soft. Has good bowel sounds. He has a right inguinal orchiectomy scar. There is no guarding or rebound tenderness. There is no palpable liver or spleen tip. Back exam shows no tenderness over the spine, ribs or hips. Extremity shows no clubbing, cyanosis or edema. Has good range motion of his joints. Skin exam shows no rashes, ecchymoses or petechia. Neurological exam shows no focal neurological deficits.  Lab Results  Component Value Date   WBC 5.5 06/05/2015   HGB 14.7 06/05/2015   HCT 43.8 06/05/2015   MCV 87 06/05/2015   PLT 225 06/05/2015     Chemistry      Component Value Date/Time   NA 142 06/05/2015 1002   NA 139 02/06/2015 1044   NA 140 12/13/2014 1137   K 4.0 06/05/2015 1002   K 4.3 02/06/2015 1044   K 4.4 12/13/2014 1137   CL 105 06/05/2015 1002   CL 105 12/13/2014 1137   CO2 29 06/05/2015 1002   CO2 25 02/06/2015 1044   CO2 23 12/13/2014 1137   BUN 8 06/05/2015 1002   BUN 11.9 02/06/2015 1044   BUN 13 12/13/2014 1137  CREATININE 1.1 06/05/2015 1002   CREATININE 0.8 02/06/2015 1044   CREATININE 0.90 12/13/2014 1137      Component Value Date/Time   CALCIUM 9.1 06/05/2015 1002   CALCIUM 9.1 02/06/2015 1044   CALCIUM 9.1 12/13/2014 1137   ALKPHOS 97* 06/05/2015 1002   ALKPHOS 102 02/06/2015 1044   ALKPHOS 102 12/13/2014 1137   AST 24 06/05/2015 1002   AST 16 02/06/2015 1044   AST 16 12/13/2014 1137   ALT 27 06/05/2015 1002   ALT 19 02/06/2015 1044   ALT 19 12/13/2014 1137   BILITOT 0.80 06/05/2015 1002   BILITOT 0.66 02/06/2015 1044   BILITOT 0.6 12/13/2014 1137         Impression and Plan: Devin Munoz is a 40 year old white male. He has a stage I seminoma of the right testicle.  I believe that the CT scan is quite accurate  is showing [place] that there is absolute no evidence of malignancy.  For now, I just do not think that we have to do any additional scans on him. I don't want to expose of any unnecessary radiation.  I will plan to see him back in 6 months. We will get tumor markers on him. If we find any change in his tumor markers or if there is any change on his physical exam, then we can do scans.  He and his wife are in agreement.   [name], MD 3/6/201711:03 AM

## 2015-06-06 ENCOUNTER — Telehealth: Payer: Self-pay

## 2015-06-06 LAB — AFP TUMOR MARKER: AFP, SERUM, TUMOR MARKER: 5.2 ng/mL (ref 0.0–8.3)

## 2015-06-06 LAB — BETA HCG QUANT (REF LAB)

## 2015-06-06 NOTE — Telephone Encounter (Signed)
Call - tumor markers are all normal!!!! Great glory to God!!! Devin Munoz   Attempted to contact patient with above message. No answer/no opportunity to leave a message. dph

## 2015-06-13 LAB — ALPHA FETO PROTEIN (PARALLEL TESTING): AFP-Tumor Marker: 4.2 ng/mL (ref <6.1)

## 2015-06-13 LAB — BETA HCG, QN (PARALLEL TESTING)

## 2015-12-11 ENCOUNTER — Ambulatory Visit (HOSPITAL_BASED_OUTPATIENT_CLINIC_OR_DEPARTMENT_OTHER): Payer: Self-pay | Admitting: Hematology & Oncology

## 2015-12-11 ENCOUNTER — Ambulatory Visit (HOSPITAL_BASED_OUTPATIENT_CLINIC_OR_DEPARTMENT_OTHER)
Admission: RE | Admit: 2015-12-11 | Discharge: 2015-12-11 | Disposition: A | Discharge status: Home / Self Care | Payer: Self-pay | Source: Ambulatory Visit | Attending: Hematology & Oncology | Admitting: Hematology & Oncology

## 2015-12-11 ENCOUNTER — Encounter: Payer: Self-pay | Admitting: Hematology & Oncology

## 2015-12-11 ENCOUNTER — Other Ambulatory Visit (HOSPITAL_BASED_OUTPATIENT_CLINIC_OR_DEPARTMENT_OTHER): Payer: Self-pay

## 2015-12-11 VITALS — BP 132/87 | HR 71 | Temp 98.0°F | Resp 18 | Ht 73.0 in | Wt 320.0 lb

## 2015-12-11 DIAGNOSIS — C6291 Malignant neoplasm of right testis, unspecified whether descended or undescended: Secondary | ICD-10-CM

## 2015-12-11 DIAGNOSIS — R109 Unspecified abdominal pain: Secondary | ICD-10-CM | POA: Insufficient documentation

## 2015-12-11 DIAGNOSIS — N5082 Scrotal pain: Secondary | ICD-10-CM

## 2015-12-11 DIAGNOSIS — N289 Disorder of kidney and ureter, unspecified: Secondary | ICD-10-CM | POA: Insufficient documentation

## 2015-12-11 LAB — CBC WITH DIFFERENTIAL (CANCER CENTER ONLY)
BASO#: 0 10*3/uL (ref 0.0–0.2)
BASO%: 0.3 % (ref 0.0–2.0)
EOS%: 3.1 % (ref 0.0–7.0)
Eosinophils Absolute: 0.2 10*3/uL (ref 0.0–0.5)
HEMATOCRIT: 43.2 % (ref 38.7–49.9)
HGB: 14.8 g/dL (ref 13.0–17.1)
LYMPH#: 2 10*3/uL (ref 0.9–3.3)
LYMPH%: 32.6 % (ref 14.0–48.0)
MCH: 29.6 pg (ref 28.0–33.4)
MCHC: 34.3 g/dL (ref 32.0–35.9)
MCV: 86 fL (ref 82–98)
MONO#: 0.5 10*3/uL (ref 0.1–0.9)
MONO%: 8.4 % (ref 0.0–13.0)
NEUT#: 3.4 10*3/uL (ref 1.5–6.5)
NEUT%: 55.6 % (ref 40.0–80.0)
PLATELETS: 222 10*3/uL (ref 145–400)
RBC: 5 10*6/uL (ref 4.20–5.70)
RDW: 13.2 % (ref 11.1–15.7)
WBC: 6 10*3/uL (ref 4.0–10.0)

## 2015-12-11 LAB — COMPREHENSIVE METABOLIC PANEL
ALBUMIN: 3.8 g/dL (ref 3.5–5.0)
ALK PHOS: 101 U/L (ref 40–150)
ALT: 18 U/L (ref 0–55)
ANION GAP: 8 meq/L (ref 3–11)
AST: 15 U/L (ref 5–34)
BUN: 13 mg/dL (ref 7.0–26.0)
CALCIUM: 9 mg/dL (ref 8.4–10.4)
CO2: 25 mEq/L (ref 22–29)
Chloride: 106 mEq/L (ref 98–109)
Creatinine: 0.9 mg/dL (ref 0.7–1.3)
Glucose: 98 mg/dl (ref 70–140)
POTASSIUM: 4.1 meq/L (ref 3.5–5.1)
SODIUM: 139 meq/L (ref 136–145)
Total Bilirubin: 0.63 mg/dL (ref 0.20–1.20)
Total Protein: 7.4 g/dL (ref 6.4–8.3)

## 2015-12-11 LAB — LACTATE DEHYDROGENASE: LDH: 171 U/L (ref 125–245)

## 2015-12-11 MED ORDER — IOPAMIDOL (ISOVUE-300) INJECTION 61%
100.0000 mL | Freq: Once | INTRAVENOUS | Status: AC | PRN
  Administered 2015-12-11: 100 mL via INTRAVENOUS

## 2015-12-11 NOTE — Progress Notes (Signed)
Hematology and Oncology Follow Up Visit  Devin Munoz [id number] 06-20-1975 40 y.o. 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 We last saw him 6 months ago. At that point in time, his tumor markers were all normal. He now is having some left-sided abdominal discomfort. This is been going on for a couple weeks or so. He says the pain seems to radiate into his left scrotum. He's not had any swelling in the scrotum.   He has had no fever. He has had no nausea or vomiting. He has had no change in bowel or bladder habits.  There's been no cough. He's had no shortness of breath.  He is trying to lose some weight.  He did have a nice summer. He has family went to the beach. They had a good time.   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  Overall, his performance status is ECOG 0.  Medications: No current outpatient prescriptions on file.  Allergies:  Allergies  Allergen Reactions  . Penicillins Anaphylaxis, Hives and Swelling  . Shellfish Allergy Hives    ALL TYPES  . Sulfa Antibiotics Hives  . Asa [Aspirin] Rash    Past Medical History, Surgical history, Social history, and Family History were reviewed and updated.  Review of Systems: As above  Physical Exam:  height is 6\' 1"  (1.854 m) and weight is 320 lb (145.2 kg) (abnormal). His oral temperature is 98 F (36.7 C). His blood pressure is 132/87 and his pulse is 71. His respiration is 18.   Wt Readings from Last 3 Encounters:  12/11/15 (!) 320 lb (145.2 kg)  06/05/15 (!) 321 lb (145.6 kg)  02/06/15 (!) 325 lb (147.4 kg)     Obese white gentleman in no obvious distress. Head and neck exam shows no ocular or oral lesions. There are no palpable cervical or supraclavicular lymph nodes. Lungs are clear. Cardiac exam regular and rhythm with a normal S1 and S2. He has  no murmurs, rubs or bruits. Abdomen is soft. Has good bowel sounds. He has a right inguinal orchiectomy scar. There is no guarding or rebound tenderness. There is no palpable liver or spleen tip. Scrotal exam shows no swelling in the left testicle. There may be some slight tenderness over the epididymis. Right scrotal sac is empty. There is no hernia. He has no inguinal adenopathy. Back exam shows no tenderness over the spine, ribs or hips. Extremity shows no clubbing, cyanosis or edema. Has good range motion of his joints. Skin exam shows no rashes, ecchymoses or petechia. Neurological exam shows no focal neurological deficits.  Lab Results  Component Value Date   WBC 5.5 06/05/2015   HGB 14.7 06/05/2015   HCT 43.8 06/05/2015   MCV 87 06/05/2015   PLT 225 06/05/2015     Chemistry      Component Value Date/Time   NA 142 06/05/2015 1002   NA 139 02/06/2015 1044   K 4.0 06/05/2015 1002   K 4.3 02/06/2015 1044   CL 105 06/05/2015 1002   CO2 29 06/05/2015 1002   CO2 25 02/06/2015 1044   BUN 8 06/05/2015 1002   BUN 11.9 02/06/2015 1044   CREATININE 1.1 06/05/2015 1002   CREATININE 0.8 02/06/2015 1044      Component Value Date/Time   CALCIUM 9.1 06/05/2015  1002   CALCIUM 9.1 02/06/2015 1044   ALKPHOS 97 (H) 06/05/2015 1002   ALKPHOS 102 02/06/2015 1044   AST 24 06/05/2015 1002   AST 16 02/06/2015 1044   ALT 27 06/05/2015 1002   ALT 19 02/06/2015 1044   BILITOT 0.80 06/05/2015 1002   BILITOT 0.66 02/06/2015 1044         Impression and Plan: Devin Munoz is a 40 year old white male. He has a stage I seminoma of the right testicle.  I believe that We will have to get a CT of his abdomen and pelvis. Again, we are pursuing active surveillance for him. He is at high risk for recurrence.  If the CT of the abdomen and pelvis is negative, and we may consider a ultrasound of the left testicle.   If all is good, we will plan to get him back in 6 more months.    [name],  MD 9/11/20178:38 Ashley Akin a nice

## 2015-12-11 NOTE — Addendum Note (Signed)
Addended by: Burney Gauze R on: 12/11/2015 04:57 PM   Modules accepted: Orders

## 2015-12-12 LAB — BETA HCG QUANT (REF LAB)

## 2015-12-12 LAB — AFP TUMOR MARKER: AFP, SERUM, TUMOR MARKER: 4.7 ng/mL (ref 0.0–8.3)

## 2015-12-12 NOTE — Addendum Note (Signed)
Addended by: Burney Gauze R on: 12/12/2015 12:06 PM   Modules accepted: Orders

## 2015-12-14 ENCOUNTER — Ambulatory Visit (HOSPITAL_BASED_OUTPATIENT_CLINIC_OR_DEPARTMENT_OTHER): Payer: Self-pay

## 2015-12-14 ENCOUNTER — Ambulatory Visit (HOSPITAL_BASED_OUTPATIENT_CLINIC_OR_DEPARTMENT_OTHER): Admission: RE | Admit: 2015-12-14 | Payer: Self-pay | Source: Ambulatory Visit

## 2015-12-14 LAB — BETA HCG, QN (PARALLEL TESTING)

## 2015-12-14 LAB — ALPHA FETO PROTEIN (PARALLEL TESTING): AFP TUMOR MARKER: 4.4 ng/mL (ref <6.1)

## 2015-12-15 ENCOUNTER — Telehealth: Payer: Self-pay

## 2015-12-15 NOTE — Telephone Encounter (Addendum)
-----   Message from [name], MD sent at 12/14/2015  5:45 PM EDT ----- Call - all of the labs look great!!  Tumor markers are all normal!!! pete   Above message left on personalized VM. dph

## 2016-06-10 ENCOUNTER — Other Ambulatory Visit (HOSPITAL_BASED_OUTPATIENT_CLINIC_OR_DEPARTMENT_OTHER): Payer: Self-pay

## 2016-06-10 ENCOUNTER — Ambulatory Visit (HOSPITAL_BASED_OUTPATIENT_CLINIC_OR_DEPARTMENT_OTHER): Payer: Self-pay | Admitting: Hematology & Oncology

## 2016-06-10 VITALS — BP 141/85 | HR 84 | Temp 98.1°F | Resp 18 | Wt 328.4 lb

## 2016-06-10 DIAGNOSIS — C6291 Malignant neoplasm of right testis, unspecified whether descended or undescended: Secondary | ICD-10-CM

## 2016-06-10 DIAGNOSIS — N5082 Scrotal pain: Secondary | ICD-10-CM

## 2016-06-10 LAB — COMPREHENSIVE METABOLIC PANEL
ALT: 24 U/L (ref 0–55)
AST: 19 U/L (ref 5–34)
Albumin: 4.1 g/dL (ref 3.5–5.0)
Alkaline Phosphatase: 107 U/L (ref 40–150)
Anion Gap: 8 mEq/L (ref 3–11)
BUN: 14.7 mg/dL (ref 7.0–26.0)
CO2: 27 meq/L (ref 22–29)
Calcium: 9.5 mg/dL (ref 8.4–10.4)
Chloride: 106 mEq/L (ref 98–109)
Creatinine: 0.9 mg/dL (ref 0.7–1.3)
GLUCOSE: 101 mg/dL (ref 70–140)
POTASSIUM: 4.3 meq/L (ref 3.5–5.1)
SODIUM: 141 meq/L (ref 136–145)
TOTAL PROTEIN: 7.4 g/dL (ref 6.4–8.3)
Total Bilirubin: 0.41 mg/dL (ref 0.20–1.20)

## 2016-06-10 LAB — CBC WITH DIFFERENTIAL (CANCER CENTER ONLY)
BASO#: 0 10*3/uL (ref 0.0–0.2)
BASO%: 0.4 % (ref 0.0–2.0)
EOS%: 3.4 % (ref 0.0–7.0)
Eosinophils Absolute: 0.2 10*3/uL (ref 0.0–0.5)
HCT: 45.1 % (ref 38.7–49.9)
HEMOGLOBIN: 15 g/dL (ref 13.0–17.1)
LYMPH#: 1.8 10*3/uL (ref 0.9–3.3)
LYMPH%: 32.2 % (ref 14.0–48.0)
MCH: 29.5 pg (ref 28.0–33.4)
MCHC: 33.3 g/dL (ref 32.0–35.9)
MCV: 89 fL (ref 82–98)
MONO#: 0.6 10*3/uL (ref 0.1–0.9)
MONO%: 10.8 % (ref 0.0–13.0)
NEUT%: 53.2 % (ref 40.0–80.0)
NEUTROS ABS: 3 10*3/uL (ref 1.5–6.5)
Platelets: 219 10*3/uL (ref 145–400)
RBC: 5.09 10*6/uL (ref 4.20–5.70)
RDW: 13 % (ref 11.1–15.7)
WBC: 5.7 10*3/uL (ref 4.0–10.0)

## 2016-06-10 LAB — LACTATE DEHYDROGENASE: LDH: 169 U/L (ref 125–245)

## 2016-06-10 NOTE — Progress Notes (Signed)
Hematology and Oncology Follow Up Visit  LEKEITH WULF [id number] April 19, 1975 41 y.o. 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 Moorer is back for follow-up. We last saw him 6 months ago. At that point in time, his tumor markers were all normal. We did a set of surveillance scans on him. The CT scans looked okay.  He is complaining of some pain over on the right side. He thinks this might be musculoskeletal. He is not taking anything for this.  He is due for some surveillance scans. We will get these all set up for him.  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 This will be a period of reflection and hopefully intimacy with God. I'm sure that he will have a very meaningful productive sabbatical. He will be away from his family.   He has had no cough. He's had no change in bowel or bladder habits. He is had no nausea or vomiting. He's got through the influenza season without any infection.   Overall, his performance status is ECOG 0.  Medications: No current outpatient prescriptions on file.  Allergies:  Allergies  Allergen Reactions  . Penicillins Anaphylaxis, Hives and Swelling  . Shellfish Allergy Hives    ALL TYPES  . Sulfa Antibiotics Hives  . Asa [Aspirin] Rash    Past Medical History, Surgical history, Social history, and Family History were reviewed and updated.  Review of Systems: As above  Physical Exam:  weight is 328 lb 6.4 oz (149 kg) (abnormal). His oral temperature is 98.1 F (36.7 C). His blood pressure is 141/85 (abnormal) and his pulse is 84. His respiration is 18 and oxygen saturation is 99%.   Wt Readings from Last 3 Encounters:  06/10/16 (!) 328 lb 6.4 oz (149 kg)  12/11/15 (!) 320 lb (145.2 kg)  06/05/15 (!) 321 lb  (145.6 kg)     Obese white gentleman in no obvious distress. Head and neck exam shows no ocular or oral lesions. There are no palpable cervical or supraclavicular lymph nodes. Lungs are clear. Cardiac exam regular and rhythm with a normal S1 and S2. He has no murmurs, rubs or bruits. Abdomen is soft. Has good bowel sounds. He has a right inguinal orchiectomy scar. There is no guarding or rebound tenderness. There is no palpable liver or spleen tip. Scrotal exam shows no swelling in the left testicle. There may be some slight tenderness over the epididymis. Right scrotal sac is empty. There is no hernia. He has no inguinal adenopathy. Back exam shows no tenderness over the spine, ribs or hips. Extremity shows no clubbing, cyanosis or edema. Has good range motion of his joints. Skin exam shows no rashes, ecchymoses or petechia. Neurological exam shows no focal neurological deficits.  Lab Results  Component Value Date   WBC 6.0 12/11/2015   HGB 14.8 12/11/2015   HCT 43.2 12/11/2015   MCV 86 12/11/2015   PLT 222 12/11/2015     Chemistry      Component Value Date/Time   NA 139 12/11/2015 0806   K 4.1 12/11/2015 0806   CL 105 06/05/2015 1002   CO2 25 12/11/2015 0806   BUN 13.0 12/11/2015 0806   CREATININE 0.9 12/11/2015 0806  Component Value Date/Time   CALCIUM 9.0 12/11/2015 0806   ALKPHOS 101 12/11/2015 0806   AST 15 12/11/2015 0806   ALT 18 12/11/2015 0806   BILITOT 0.63 12/11/2015 0806         Impression and Plan: Mr. Hallas is a 41 year old white male. He has a stage I seminoma of the right testicle.  I believe that we will have to get a CT of his abdomen and pelvis. Again, we are pursuing active surveillance for him. He is at high risk for recurrence.We will do scans on him this year and then after that, we can just go with blood work.  I'll plan to get him back in 6 more months. At some point in the fall, we'll do another set of scans on him.  I still think the  chance of recurrence will be less than 10%.   [name], MD 3/12/20188:16 AM

## 2016-06-11 ENCOUNTER — Telehealth: Payer: Self-pay | Admitting: *Deleted

## 2016-06-11 LAB — BETA HCG QUANT (REF LAB): hCG Quant: <1 m[IU]/mL (ref 0–3)

## 2016-06-11 LAB — AFP TUMOR MARKER: AFP, Serum, Tumor Marker: 4.9 ng/mL (ref 0.0–8.3)

## 2016-06-11 NOTE — Telephone Encounter (Addendum)
Patient is aware of results.   ----- Message from [name], MD sent at 06/11/2016 11:28 AM EDT ----- Call and let him know that the tumor markers are normal for his testicular cancer. Thank you

## 2016-06-13 ENCOUNTER — Ambulatory Visit (HOSPITAL_BASED_OUTPATIENT_CLINIC_OR_DEPARTMENT_OTHER)
Admission: RE | Admit: 2016-06-13 | Discharge: 2016-06-13 | Disposition: A | Discharge status: Home / Self Care | Payer: Self-pay | Source: Ambulatory Visit | Attending: Hematology & Oncology | Admitting: Hematology & Oncology

## 2016-06-13 ENCOUNTER — Encounter (HOSPITAL_BASED_OUTPATIENT_CLINIC_OR_DEPARTMENT_OTHER): Payer: Self-pay

## 2016-06-13 DIAGNOSIS — C6291 Malignant neoplasm of right testis, unspecified whether descended or undescended: Secondary | ICD-10-CM

## 2016-06-13 DIAGNOSIS — Z9079 Acquired absence of other genital organ(s): Secondary | ICD-10-CM | POA: Insufficient documentation

## 2016-06-13 MED ORDER — IOPAMIDOL (ISOVUE-300) INJECTION 61%
100.0000 mL | Freq: Once | INTRAVENOUS | Status: AC | PRN
  Administered 2016-06-13: 100 mL via INTRAVENOUS

## 2016-06-14 ENCOUNTER — Telehealth: Payer: Self-pay | Admitting: *Deleted

## 2016-06-14 ENCOUNTER — Other Ambulatory Visit (HOSPITAL_BASED_OUTPATIENT_CLINIC_OR_DEPARTMENT_OTHER): Payer: Self-pay

## 2016-06-14 NOTE — Telephone Encounter (Addendum)
Patient aware of results  ----- Message from [name], MD sent at 06/13/2016  8:35 PM EDT ----- Call - No recurrent cancer on the CT scan.  pete

## 2016-12-09 ENCOUNTER — Ambulatory Visit (HOSPITAL_BASED_OUTPATIENT_CLINIC_OR_DEPARTMENT_OTHER): Payer: Self-pay | Admitting: Hematology & Oncology

## 2016-12-09 ENCOUNTER — Ambulatory Visit (HOSPITAL_BASED_OUTPATIENT_CLINIC_OR_DEPARTMENT_OTHER)
Admission: RE | Admit: 2016-12-09 | Discharge: 2016-12-09 | Disposition: A | Discharge status: Home / Self Care | Payer: Self-pay | Source: Ambulatory Visit | Attending: Hematology & Oncology | Admitting: Hematology & Oncology

## 2016-12-09 ENCOUNTER — Other Ambulatory Visit (HOSPITAL_BASED_OUTPATIENT_CLINIC_OR_DEPARTMENT_OTHER): Payer: Self-pay

## 2016-12-09 ENCOUNTER — Telehealth: Payer: Self-pay | Admitting: *Deleted

## 2016-12-09 VITALS — BP 122/78 | HR 90 | Temp 98.5°F | Wt 321.0 lb

## 2016-12-09 DIAGNOSIS — C6291 Malignant neoplasm of right testis, unspecified whether descended or undescended: Secondary | ICD-10-CM

## 2016-12-09 DIAGNOSIS — R590 Localized enlarged lymph nodes: Secondary | ICD-10-CM | POA: Insufficient documentation

## 2016-12-09 LAB — CBC WITH DIFFERENTIAL (CANCER CENTER ONLY)
BASO#: 0 10*3/uL (ref 0.0–0.2)
BASO%: 0.2 % (ref 0.0–2.0)
EOS ABS: 0.1 10*3/uL (ref 0.0–0.5)
EOS%: 2 % (ref 0.0–7.0)
HCT: 43.7 % (ref 38.7–49.9)
HGB: 14.7 g/dL (ref 13.0–17.1)
LYMPH#: 1.9 10*3/uL (ref 0.9–3.3)
LYMPH%: 32.1 % (ref 14.0–48.0)
MCH: 29.3 pg (ref 28.0–33.4)
MCHC: 33.6 g/dL (ref 32.0–35.9)
MCV: 87 fL (ref 82–98)
MONO#: 0.6 10*3/uL (ref 0.1–0.9)
MONO%: 9.7 % (ref 0.0–13.0)
NEUT#: 3.3 10*3/uL (ref 1.5–6.5)
NEUT%: 56 % (ref 40.0–80.0)
Platelets: 214 10*3/uL (ref 145–400)
RBC: 5.01 10*6/uL (ref 4.20–5.70)
RDW: 12.9 % (ref 11.1–15.7)
WBC: 5.9 10*3/uL (ref 4.0–10.0)

## 2016-12-09 LAB — CMP (CANCER CENTER ONLY)
ALK PHOS: 94 U/L — AB (ref 26–84)
ALT: 28 U/L (ref 10–47)
AST: 23 U/L (ref 11–38)
Albumin: 3.7 g/dL (ref 3.3–5.5)
BUN: 12 mg/dL (ref 7–22)
CHLORIDE: 106 meq/L (ref 98–108)
CO2: 28 mEq/L (ref 18–33)
Calcium: 9 mg/dL (ref 8.0–10.3)
Creat: 1 mg/dl (ref 0.6–1.2)
GLUCOSE: 110 mg/dL (ref 73–118)
POTASSIUM: 4.2 meq/L (ref 3.3–4.7)
Sodium: 142 mEq/L (ref 128–145)
Total Bilirubin: 0.8 mg/dl (ref 0.20–1.60)
Total Protein: 7.2 g/dL (ref 6.4–8.1)

## 2016-12-09 LAB — LACTATE DEHYDROGENASE: LDH: 167 U/L (ref 125–245)

## 2016-12-09 MED ORDER — IOPAMIDOL (ISOVUE-300) INJECTION 61%
100.0000 mL | Freq: Once | INTRAVENOUS | Status: AC | PRN
  Administered 2016-12-09: 100 mL via INTRAVENOUS

## 2016-12-09 NOTE — Progress Notes (Signed)
Hematology and Oncology Follow Up Visit  Devin Munoz [id number] 1975-09-21 41 y.o. 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 We last saw him 6 months ago. Since then, he is doing pretty well. 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  His only complaint is that he has occasional right upper quadrant abdominal pain. This appears to be worse when he eats. I suppose this could be gallbladder disease.  He has had no issues with bowels or bladder. He's had no rashes. He's had no leg swelling. He's had no cough or shortness of breath.  His last tumor markers with respect to alpha-fetoprotein and beta-hCG were both normal. His LDH was 169.   Currently, his performance status is ECOG 1.  Medications: No current outpatient prescriptions on file.  Allergies:  Allergies  Allergen Reactions  . Penicillins Anaphylaxis, Hives and Swelling  . Shellfish Allergy Hives    ALL TYPES  . Sulfa Antibiotics Hives  . Asa [Aspirin] Rash    Past Medical History, Surgical history, Social history, and Family History were reviewed and updated.  Review of Systems: As stated in the interim history  Physical Exam:  weight is 321 lb (145.6 kg) (abnormal). His oral temperature is 98.5 F (36.9 C). His blood pressure is 122/78 and his pulse is 90.   Wt Readings from Last 3 Encounters:  12/09/16 (!) 321 lb (145.6 kg)  06/10/16 (!) 328 lb 6.4 oz (149 kg)  12/11/15 (!) 320 lb (145.2 kg)     Physical Exam  Constitutional: He is oriented to person, place, and time.  HENT:  Head: Normocephalic and atraumatic.  Mouth/Throat: Oropharynx is clear and moist.  Eyes: Pupils are equal, round, and reactive to light. EOM are normal.  Neck: Normal range of motion.  Cardiovascular: Normal rate,  regular rhythm and normal heart sounds.   Pulmonary/Chest: Effort normal and breath sounds normal.  Abdominal: Soft. Bowel sounds are normal.  He has a well-healed right inguinal orchiectomy scar.  Musculoskeletal: Normal range of motion. He exhibits no edema, tenderness or deformity.  Lymphadenopathy:    He has no cervical adenopathy.  Neurological: He is alert and oriented to person, place, and time.  Skin: Skin is warm and dry. No rash noted. No erythema.  Psychiatric: He has a normal mood and affect. His behavior is normal. Judgment and thought content normal.  Vitals reviewed.    Lab Results  Component Value Date   WBC 5.7 06/10/2016   HGB 15.0 06/10/2016   HCT 45.1 06/10/2016   MCV 89 06/10/2016   PLT 219 06/10/2016     Chemistry      Component Value Date/Time   NA 141 06/10/2016 0742   K 4.3 06/10/2016 0742   CL 105 06/05/2015 1002   CO2 27 06/10/2016 0742   BUN 14.7 06/10/2016 0742   CREATININE 0.9 06/10/2016 0742      Component Value Date/Time   CALCIUM 9.5 06/10/2016 0742   ALKPHOS 107 06/10/2016 0742   AST 19 06/10/2016 0742   ALT 24 06/10/2016 0742   BILITOT 0.41 06/10/2016 0742         Impression and Plan: Devin Munoz is a 41 year old white male. He has a stage I seminoma of the right testicle.  We  will go ahead and get his CAT scans today. I think this would be very reasonable.  I would think that his risk of recurrence is going be less than 10%.  I think that everything looks okay with this CT scan, we can probably just follow him along with lab work.  He is okay with this.  We'll see him back in 6 months.  [name], MD 9/10/20188:09 AM

## 2016-12-09 NOTE — Telephone Encounter (Signed)
-----   Message from [name], MD sent at 12/09/2016 11:49 AM EDT ----- Call - CT scan is negative for any recurrent testicular cancer!!  Laurey Arrow

## 2016-12-10 LAB — BETA HCG QUANT (REF LAB): hCG Quant: <1 m[IU]/mL (ref 0–3)

## 2016-12-10 LAB — AFP TUMOR MARKER: AFP, SERUM, TUMOR MARKER: 4.3 ng/mL (ref 0.0–8.3)

## 2017-06-09 ENCOUNTER — Inpatient Hospital Stay (HOSPITAL_BASED_OUTPATIENT_CLINIC_OR_DEPARTMENT_OTHER): Payer: Self-pay | Admitting: Hematology & Oncology

## 2017-06-09 ENCOUNTER — Telehealth: Payer: Self-pay | Admitting: *Deleted

## 2017-06-09 ENCOUNTER — Inpatient Hospital Stay: Payer: Self-pay | Attending: Hematology & Oncology

## 2017-06-09 ENCOUNTER — Other Ambulatory Visit: Payer: Self-pay

## 2017-06-09 VITALS — BP 123/79 | HR 76 | Temp 97.6°F | Resp 18 | Wt 327.0 lb

## 2017-06-09 DIAGNOSIS — C6291 Malignant neoplasm of right testis, unspecified whether descended or undescended: Secondary | ICD-10-CM

## 2017-06-09 LAB — CBC WITH DIFFERENTIAL (CANCER CENTER ONLY)
Basophils Absolute: 0 10*3/uL (ref 0.0–0.1)
Basophils Relative: 0 %
EOS PCT: 3 %
Eosinophils Absolute: 0.2 10*3/uL (ref 0.0–0.5)
HEMATOCRIT: 44.8 % (ref 38.7–49.9)
Hemoglobin: 14.9 g/dL (ref 13.0–17.1)
LYMPHS PCT: 37 %
Lymphs Abs: 2.1 10*3/uL (ref 0.9–3.3)
MCH: 29.1 pg (ref 28.0–33.4)
MCHC: 33.3 g/dL (ref 32.0–35.9)
MCV: 87.5 fL (ref 82.0–98.0)
MONO ABS: 0.5 10*3/uL (ref 0.1–0.9)
MONOS PCT: 10 %
NEUTROS ABS: 2.8 10*3/uL (ref 1.5–6.5)
Neutrophils Relative %: 50 %
PLATELETS: 219 10*3/uL (ref 145–400)
RBC: 5.12 MIL/uL (ref 4.20–5.70)
RDW: 13.1 % (ref 11.1–15.7)
WBC Count: 5.6 10*3/uL (ref 4.0–10.0)

## 2017-06-09 LAB — CMP (CANCER CENTER ONLY)
ALT: 21 U/L (ref 0–55)
ANION GAP: 8 (ref 3–11)
AST: 20 U/L (ref 5–34)
Albumin: 4 g/dL (ref 3.5–5.0)
Alkaline Phosphatase: 85 U/L (ref 40–150)
BILIRUBIN TOTAL: 0.6 mg/dL (ref 0.2–1.2)
BUN: 12 mg/dL (ref 7–26)
CHLORIDE: 106 mmol/L (ref 98–109)
CO2: 25 mmol/L (ref 22–29)
Calcium: 9.7 mg/dL (ref 8.4–10.4)
Creatinine: 1 mg/dL (ref 0.70–1.30)
Glucose, Bld: 102 mg/dL (ref 70–140)
POTASSIUM: 4.7 mmol/L (ref 3.5–5.1)
Sodium: 139 mmol/L (ref 136–145)
TOTAL PROTEIN: 7.6 g/dL (ref 6.4–8.3)

## 2017-06-09 LAB — LIPID PANEL
CHOLESTEROL: 183 mg/dL (ref 0–200)
HDL: 46 mg/dL (ref >40)
LDL Cholesterol: 124 mg/dL — ABNORMAL HIGH (ref 0–99)
Total CHOL/HDL Ratio: 4 RATIO
Triglycerides: 63 mg/dL (ref <150)
VLDL: 13 mg/dL (ref 0–40)

## 2017-06-09 LAB — LACTATE DEHYDROGENASE: LDH: 181 U/L (ref 125–245)

## 2017-06-09 NOTE — Progress Notes (Signed)
Hematology and Oncology Follow Up Visit  NOLAWI KANADY [id number] June 11, 1975 42 y.o. 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 Caples is back for follow-up. We last saw him 6 months ago.  Everything is going quite well.  I am still a little bit bothered by the fact that he is gained weight.  Hopefully, he will lose weight.  I told him that man with a history of testicular cancer do have a higher risk of coronary artery disease because of cholesterol issues.  Hopefully, he will lose weight.  If not, they might need to consider gastric bypass.  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  He was quite busy over the holidays.    His tumor markers were normal.  He has orchiectomy back in November 2015.    Currently, his performance status is ECOG 1.   Medications: No current outpatient medications on file.  Allergies:  Allergies  Allergen Reactions  . Penicillins Anaphylaxis, Hives and Swelling  . Shellfish Allergy Hives    ALL TYPES  . Sulfa Antibiotics Hives  . Asa [Aspirin] Rash    Past Medical History, Surgical history, Social history, and Family History were reviewed and updated.  Review of Systems: Review of Systems  Constitutional: Negative.   HENT: Negative.   Eyes: Negative.   Respiratory: Negative.   Cardiovascular: Negative.   Gastrointestinal: Negative.   Genitourinary: Negative.   Musculoskeletal: Negative.   Skin: Negative.   Neurological: Negative.   Endo/Heme/Allergies: Negative.   Psychiatric/Behavioral: Negative.     Physical Exam:  weight is 327 lb (148.3 kg) (abnormal). His oral temperature is 97.6 F (36.4 C). His blood pressure is 123/79 and his pulse is 76. His respiration is 18 and oxygen saturation is 97%.   Wt Readings from Last 3 Encounters:  06/09/17 (!) 327 lb (148.3 kg)  12/09/16 (!)  321 lb (145.6 kg)  06/10/16 (!) 328 lb 6.4 oz (149 kg)     Physical Exam  Constitutional: He is oriented to person, place, and time.  HENT:  Head: Normocephalic and atraumatic.  Mouth/Throat: Oropharynx is clear and moist.  Eyes: EOM are normal. Pupils are equal, round, and reactive to light.  Neck: Normal range of motion.  Cardiovascular: Normal rate, regular rhythm and normal heart sounds.  Pulmonary/Chest: Effort normal and breath sounds normal.  Abdominal: Soft. Bowel sounds are normal.  He has a well-healed right inguinal orchiectomy scar.  Musculoskeletal: Normal range of motion. He exhibits no edema, tenderness or deformity.  Lymphadenopathy:    He has no cervical adenopathy.  Neurological: He is alert and oriented to person, place, and time.  Skin: Skin is warm and dry. No rash noted. No erythema.  Psychiatric: He has a normal mood and affect. His behavior is normal. Judgment and thought content normal.  Vitals reviewed.    Lab Results  Component Value Date   WBC 5.6 06/09/2017   HGB 14.7 12/09/2016   HCT 44.8 06/09/2017   MCV 87.5 06/09/2017   PLT 219 06/09/2017     Chemistry      Component Value Date/Time   NA 142 12/09/2016 0750   NA 141 06/10/2016 0742   K 4.2 12/09/2016 0750   K 4.3 06/10/2016 0742   CL 106 12/09/2016 0750   CO2 28 12/09/2016 0750   CO2  27 06/10/2016 0742   BUN 12 12/09/2016 0750   BUN 14.7 06/10/2016 0742   CREATININE 1.0 12/09/2016 0750   CREATININE 0.9 06/10/2016 0742      Component Value Date/Time   CALCIUM 9.0 12/09/2016 0750   CALCIUM 9.5 06/10/2016 0742   ALKPHOS 94 (H) 12/09/2016 0750   ALKPHOS 107 06/10/2016 0742   AST 23 12/09/2016 0750   AST 19 06/10/2016 0742   ALT 28 12/09/2016 0750   ALT 24 06/10/2016 0742   BILITOT 0.80 12/09/2016 0750   BILITOT 0.41 06/10/2016 0742         Impression and Plan: Mr. Balsam is a 42 year old white male. He has a stage I seminoma of the right testicle.  We will see what  his tumor markers show.  I do not think we need to do any scans on him unless the tumor markers are elevated.  We will see him back in 6 months.  If all looks good in 6 months, then I think we can move his appointments to yearly.  [name], MD 3/11/20198:45 AM

## 2017-06-09 NOTE — Telephone Encounter (Addendum)
-----   Message from [name], MD sent at 06/09/2017  1:59 PM EDT -----   Called the patient and tell him that the cholesterol is on the high side! Labs routed to Dr. Cyndy Freeze MD.  Patient informed

## 2017-06-10 ENCOUNTER — Telehealth: Payer: Self-pay

## 2017-06-10 LAB — BETA HCG QUANT (REF LAB): hCG Quant: <1 m[IU]/mL (ref 0–3)

## 2017-06-10 LAB — AFP TUMOR MARKER: AFP, SERUM, TUMOR MARKER: 5.2 ng/mL (ref 0.0–8.3)

## 2017-06-10 NOTE — Telephone Encounter (Addendum)
-----   Message from [name], MD sent at 06/10/2017  6:23 AM EDT ----- Call - tumor markers ae normal!!!  Pete  Above message given to pt per Dr Marin Olp. Pt verbalizes understanding. dph

## 2017-12-08 ENCOUNTER — Encounter: Payer: Self-pay | Admitting: Hematology & Oncology

## 2017-12-08 ENCOUNTER — Other Ambulatory Visit: Payer: Self-pay

## 2017-12-08 ENCOUNTER — Inpatient Hospital Stay: Payer: Self-pay | Attending: Hematology & Oncology | Admitting: Hematology & Oncology

## 2017-12-08 ENCOUNTER — Inpatient Hospital Stay: Payer: Self-pay

## 2017-12-08 VITALS — BP 145/87 | HR 72 | Temp 98.3°F | Resp 16 | Wt 313.0 lb

## 2017-12-08 DIAGNOSIS — C6291 Malignant neoplasm of right testis, unspecified whether descended or undescended: Secondary | ICD-10-CM | POA: Insufficient documentation

## 2017-12-08 DIAGNOSIS — E7801 Familial hypercholesterolemia: Secondary | ICD-10-CM

## 2017-12-08 LAB — CMP (CANCER CENTER ONLY)
ALT: 25 U/L (ref 10–47)
ANION GAP: 3 — AB (ref 5–15)
AST: 24 U/L (ref 11–38)
Albumin: 3.9 g/dL (ref 3.5–5.0)
Alkaline Phosphatase: 84 U/L (ref 26–84)
BUN: 13 mg/dL (ref 7–22)
CO2: 28 mmol/L (ref 18–33)
Calcium: 8.8 mg/dL (ref 8.0–10.3)
Chloride: 111 mmol/L — ABNORMAL HIGH (ref 98–108)
Creatinine: 1.1 mg/dL (ref 0.60–1.20)
GLUCOSE: 109 mg/dL (ref 73–118)
POTASSIUM: 3.9 mmol/L (ref 3.3–4.7)
Sodium: 142 mmol/L (ref 128–145)
TOTAL PROTEIN: 6.9 g/dL (ref 6.4–8.1)
Total Bilirubin: 0.6 mg/dL (ref 0.2–1.6)

## 2017-12-08 LAB — CBC WITH DIFFERENTIAL (CANCER CENTER ONLY)
Basophils Absolute: 0 10*3/uL (ref 0.0–0.1)
Basophils Relative: 0 %
EOS PCT: 3 %
Eosinophils Absolute: 0.1 10*3/uL (ref 0.0–0.5)
HCT: 42.3 % (ref 38.7–49.9)
Hemoglobin: 14 g/dL (ref 13.0–17.1)
LYMPHS ABS: 1.6 10*3/uL (ref 0.9–3.3)
LYMPHS PCT: 34 %
MCH: 29.3 pg (ref 28.0–33.4)
MCHC: 33.1 g/dL (ref 32.0–35.9)
MCV: 88.5 fL (ref 82.0–98.0)
MONO ABS: 0.5 10*3/uL (ref 0.1–0.9)
MONOS PCT: 10 %
NEUTROS ABS: 2.5 10*3/uL (ref 1.5–6.5)
NEUTROS PCT: 53 %
PLATELETS: 191 10*3/uL (ref 145–400)
RBC: 4.78 MIL/uL (ref 4.20–5.70)
RDW: 13.2 % (ref 11.1–15.7)
WBC Count: 4.8 10*3/uL (ref 4.0–10.0)

## 2017-12-08 LAB — LACTATE DEHYDROGENASE: LDH: 163 U/L (ref 98–192)

## 2017-12-08 NOTE — Progress Notes (Signed)
Hematology and Oncology Follow Up Visit  Devin Munoz [id number] 09-Dec-1975 42 y.o. 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 We last saw him 6 months ago.  So far, things are going quite well.  He is losing weight.  He is dropped about 20 pounds since we last saw him.  He is trying to watch his weight.  Unfortunately does not have a family doctor now.  He really needs to have a family doctor to monitor his lipids.  We did send off his cholesterol today.  We last saw him, his tumor markers all look great.  His alpha-fetoprotein was 5.2.  His beta-hCG was less than 1.  His LDH was 118.  He is had no cough or shortness of breath.  He has had no change in bowel or bladder habits.  He has had no rashes.  He has had no leg swelling.  He has had no fever.  Currently, his performance status is ECOG 1.   Medications: No current outpatient medications on file.  Allergies:  Allergies  Allergen Reactions  . Penicillins Anaphylaxis, Hives and Swelling  . Shellfish Allergy Hives    ALL TYPES  . Sulfa Antibiotics Hives  . Asa [Aspirin] Rash    Past Medical History, Surgical history, Social history, and Family History were reviewed and updated.  Review of Systems: Review of Systems  Constitutional: Negative.   HENT: Negative.   Eyes: Negative.   Respiratory: Negative.   Cardiovascular: Negative.   Gastrointestinal: Negative.   Genitourinary: Negative.   Musculoskeletal: Negative.   Skin: Negative.   Neurological: Negative.   Endo/Heme/Allergies: Negative.   Psychiatric/Behavioral: Negative.     Physical Exam:  weight is 313 lb (142 kg) (abnormal). His oral temperature is 98.3 F (36.8 C). His blood pressure is 145/87 (abnormal) and his pulse is 72. His respiration is 16 and oxygen saturation is 99%.   Wt  Readings from Last 3 Encounters:  12/08/17 (!) 313 lb (142 kg)  06/09/17 (!) 327 lb (148.3 kg)  12/09/16 (!) 321 lb (145.6 kg)     Physical Exam  Constitutional: He is oriented to person, place, and time.  HENT:  Head: Normocephalic and atraumatic.  Mouth/Throat: Oropharynx is clear and moist.  Eyes: Pupils are equal, round, and reactive to light. EOM are normal.  Neck: Normal range of motion.  Cardiovascular: Normal rate, regular rhythm and normal heart sounds.  Pulmonary/Chest: Effort normal and breath sounds normal.  Abdominal: Soft. Bowel sounds are normal.  He has a well-healed right inguinal orchiectomy scar.  Musculoskeletal: Normal range of motion. He exhibits no edema, tenderness or deformity.  Lymphadenopathy:    He has no cervical adenopathy.  Neurological: He is alert and oriented to person, place, and time.  Skin: Skin is warm and dry. No rash noted. No erythema.  Psychiatric: He has a normal mood and affect. His behavior is normal. Judgment and thought content normal.  Vitals reviewed.    Lab Results  Component Value Date   WBC 4.8 12/08/2017   HGB 14.0 12/08/2017   HCT 42.3 12/08/2017   MCV 88.5 12/08/2017   PLT 191 12/08/2017     Chemistry      Component Value Date/Time   NA 142 12/08/2017 0741   NA 142 12/09/2016 0750   NA  141 06/10/2016 0742   K 3.9 12/08/2017 0741   K 4.2 12/09/2016 0750   K 4.3 06/10/2016 0742   CL 111 (H) 12/08/2017 0741   CL 106 12/09/2016 0750   CO2 28 12/08/2017 0741   CO2 28 12/09/2016 0750   CO2 27 06/10/2016 0742   BUN 13 12/08/2017 0741   BUN 12 12/09/2016 0750   BUN 14.7 06/10/2016 0742   CREATININE 1.10 12/08/2017 0741   CREATININE 1.0 12/09/2016 0750   CREATININE 0.9 06/10/2016 0742      Component Value Date/Time   CALCIUM 8.8 12/08/2017 0741   CALCIUM 9.0 12/09/2016 0750   CALCIUM 9.5 06/10/2016 0742   ALKPHOS 84 12/08/2017 0741   ALKPHOS 94 (H) 12/09/2016 0750   ALKPHOS 107 06/10/2016 0742   AST 24  12/08/2017 0741   AST 19 06/10/2016 0742   ALT 25 12/08/2017 0741   ALT 28 12/09/2016 0750   ALT 24 06/10/2016 0742   BILITOT 0.6 12/08/2017 0741   BILITOT 0.41 06/10/2016 0742         Impression and Plan: Devin Munoz is a 42 year old white male. He has a stage I seminoma of the right testicle.  I really think that his testicular cancer is not going to be a problem for him.  I just do not see that this will recur.  Is now been close to 4 years that he had the orchiectomy.  His tumor markers have been normal.  I think that his biggest risk long-term is his weight and his cholesterol.  Hopefully, he will find a family doctor.  Still do not see that we have to do any scans on him unless there is a problem with his tumor markers.  We will see him back in 6 months.    [name], MD 9/9/20198:19 AM

## 2017-12-09 LAB — FOLLICLE STIMULATING HORMONE: FSH: 16.7 m[IU]/mL — ABNORMAL HIGH (ref 1.5–12.4)

## 2017-12-09 LAB — AFP TUMOR MARKER: AFP, Serum, Tumor Marker: 4.7 ng/mL (ref 0.0–8.3)

## 2018-04-02 ENCOUNTER — Telehealth: Payer: Self-pay | Admitting: *Deleted

## 2018-04-02 NOTE — Telephone Encounter (Signed)
Patient is having multiple GI symptoms including yellow stool, abdominal pain and gas, diarrhea, in addition to RIGHT shoulder pain and darker urine. He wonders if he is having issues with his gallbladder. He wants to make sure that none of the symptoms could be related to his testicular cancer history.  Spoke with Dr Marin Olp. He does not feel like any of the symptoms are related to her cancer history. He would like patient to follow up with his PCP and have them make a referral to a GI physician.   Patient understood recommendations.

## 2018-06-08 ENCOUNTER — Ambulatory Visit: Payer: Self-pay | Admitting: Hematology & Oncology

## 2018-06-08 ENCOUNTER — Other Ambulatory Visit: Payer: Self-pay

## 2018-06-28 IMAGING — CT CT ABD-PELV W/ CM
2 of 5 series · 15 of 46 positions shown, 17 images · IV contrast (iopamidol)
Comparison: CT 06/05/2015, 02/06/2015 and 02/11/2014.

CLINICAL DATA: Left-sided low abdominal pain extending into the
testicle for 1 year. Worsening pain after lithotripsy. History of
right-sided testicular cancer with surgery 2 years ago.

EXAM:
CT ABDOMEN AND PELVIS WITH CONTRAST
TECHNIQUE: Multidetector CT imaging of the abdomen and pelvis was performed
using the standard protocol following bolus administration of
intravenous contrast.
CONTRAST:  100mL [id number] IOPAMIDOL ([id number]) INJECTION 61%

[Series 5: coronal st · coronal · 0.93mm/px · 3 of 122 slices shown]
[im 41/122  soft-tissue]
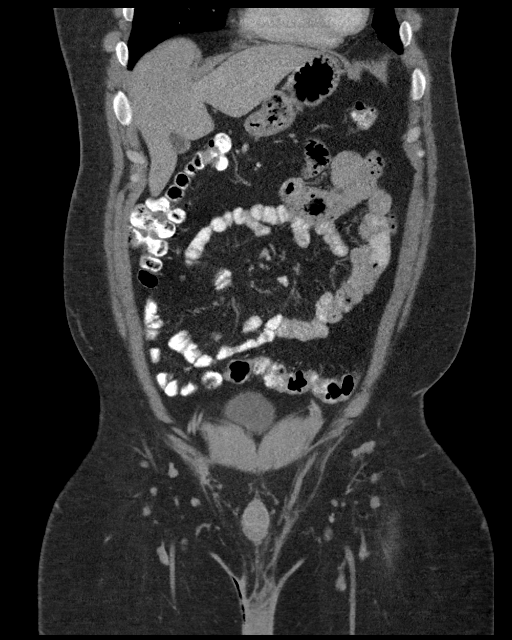
[im 54/122  soft-tissue]
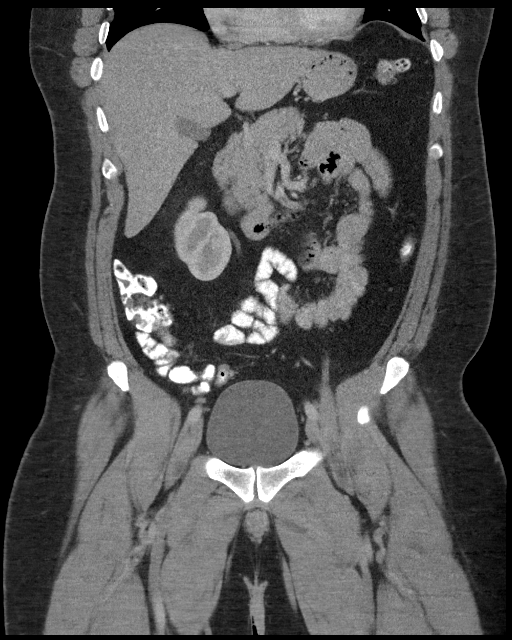
[im 68/122  soft-tissue]
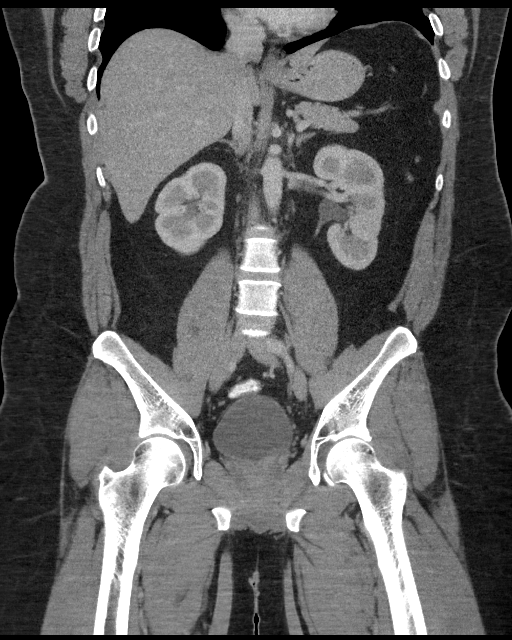

[Series 8: axial st · axial · 0.98mm/px · z∈[+736,+1232]mm · 12 of 117 slices shown, 14 images]
[im 9/117  soft-tissue]
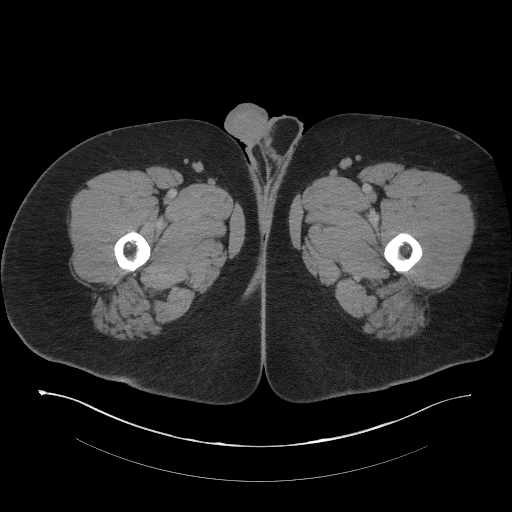
[im 9/117  bone]
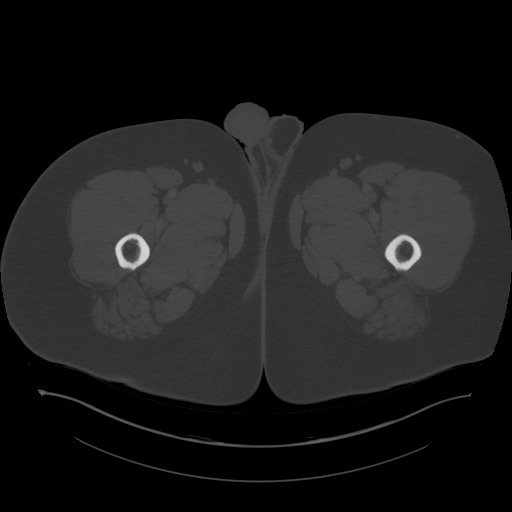
[im 17/117  soft-tissue]
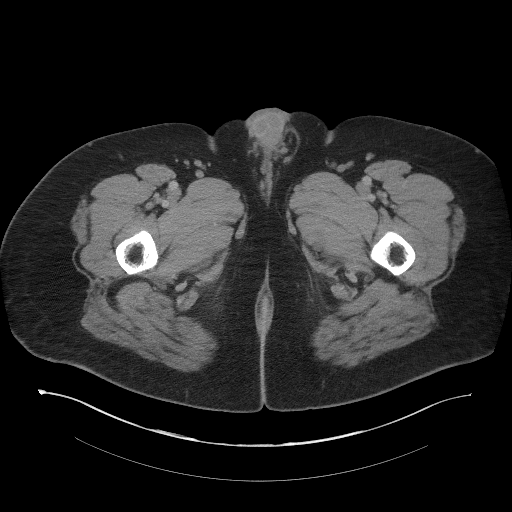
[im 25/117  soft-tissue]
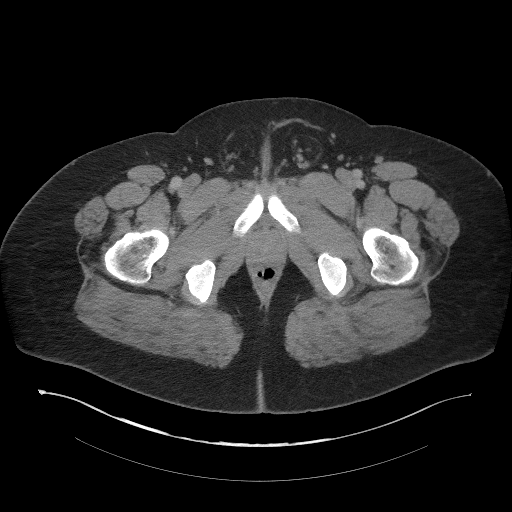
[im 34/117  soft-tissue]
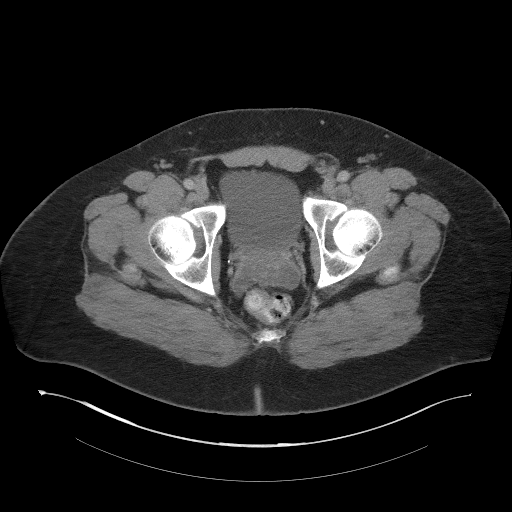
[im 42/117  soft-tissue]
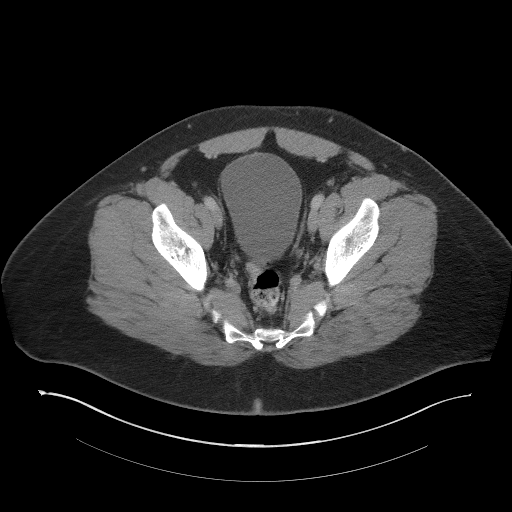
[im 50/117  soft-tissue]
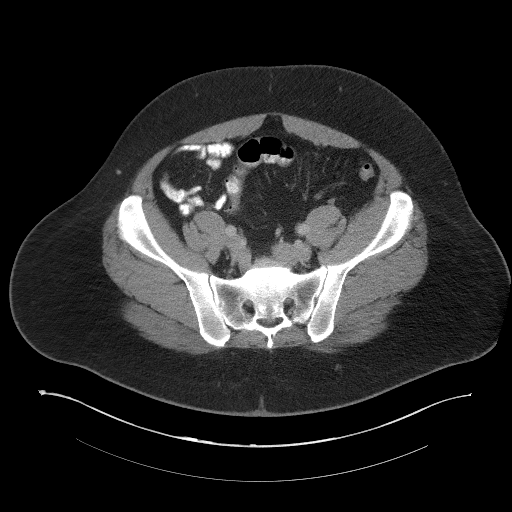
[im 67/117  soft-tissue]
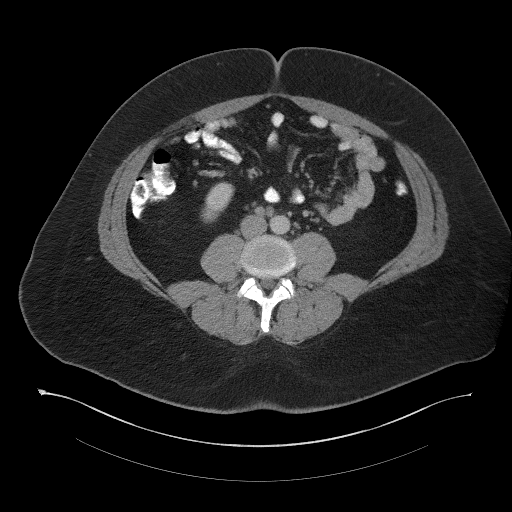
[im 75/117  soft-tissue]
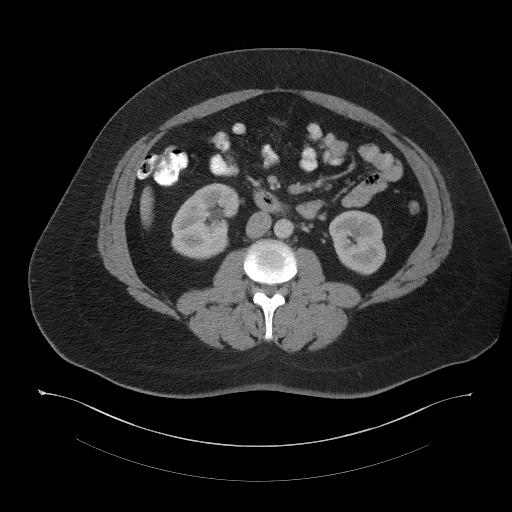
[im 83/117  soft-tissue]
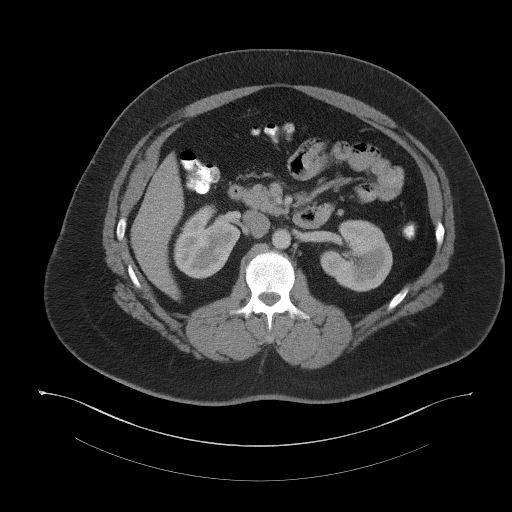
[im 83/117  bone]
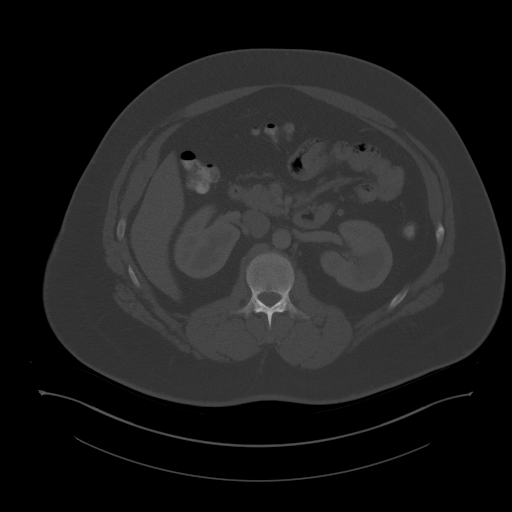
[im 92/117  soft-tissue]
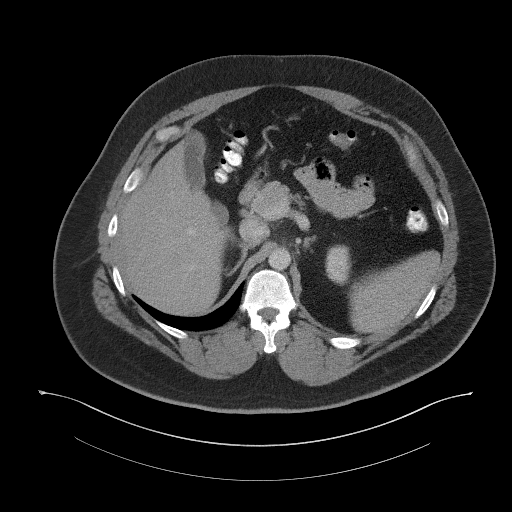
[im 100/117  soft-tissue]
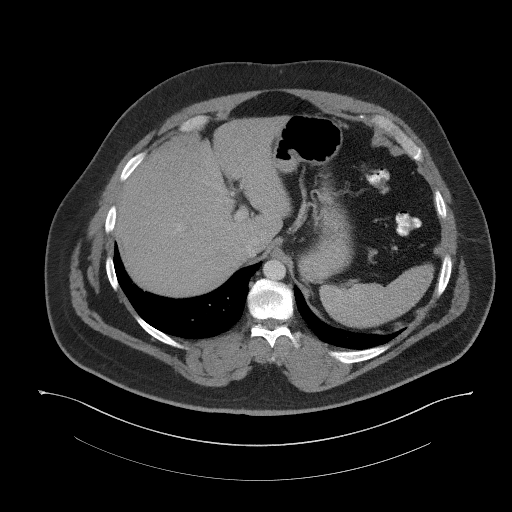
[im 108/117  soft-tissue]
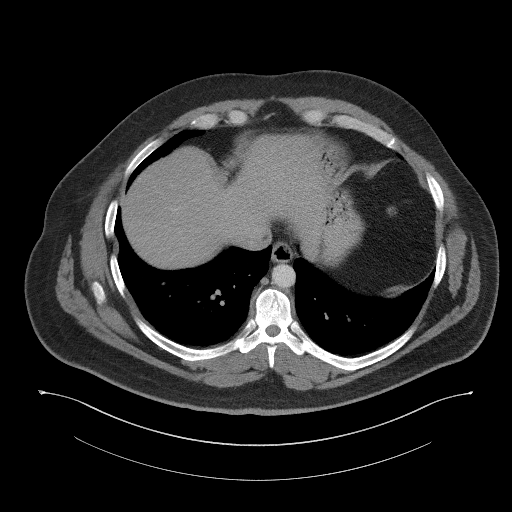

[15 of 46 positions shown; findings below may reference images not displayed]

FINDINGS: Lower chest: Clear lung bases. No significant pleural or pericardial
effusion.

Hepatobiliary: A 7 mm low-density lesion in the dome of the right
hepatic lobe (image 14) has slightly enlarged, previously measuring
5 mm. This is visible on prior studies dating back to at least
02/11/2014. The lesion is not well characterized based on its small
size. No new hepatic lesions are identified. No evidence of
gallstones, gallbladder wall thickening or biliary dilatation.

Pancreas: Unremarkable. No pancreatic ductal dilatation or
surrounding inflammatory changes.

Spleen: Normal in size without focal abnormality.

Adrenals/Urinary Tract: Both adrenal glands appear normal. 7 mm
low-density lesion posteriorly in the upper interpolar region of the
right kidney (image 19 of series 7) is unchanged from the study of
10 months ago, but too small to characterize. The left kidney
appears normal. No evidence of urinary tract calculus or
hydronephrosis. The bladder appears normal.

Stomach/Bowel: No evidence of bowel wall thickening, distention or
surrounding inflammatory change. Mild diverticular changes of the
sigmoid colon. The appendix appears normal.

Vascular/Lymphatic: Small retroperitoneal lymph nodes are again
noted, including a 10 mm aortocaval node on image 52 (previously 11
mm). No enlarging lymph nodes are seen. No significant vascular
findings are present.

Reproductive: There is stable chronic prominence of the seminal
vesicles. The prostate gland appears normal. There are stable
postsurgical changes status post right orchidectomy.

Other: No ascites.

Musculoskeletal: No acute or significant osseous findings. Stable
chronic bilateral L5 pars defects with resulting mild
anterolisthesis at L5-S1.
IMPRESSION: 1. No acute findings or explanation for the patient's symptoms.
2. Previously demonstrated retroperitoneal lymph nodes are stable to
decreased in size. No progressive adenopathy.
3. Small low-density lesion in the dome of the right hepatic lobe
demonstrates slow growth compared with prior studies. This remains
too small to optimally characterize, although is favored to reflect
a benign defined given its presence since at least 02/11/2014. This
could be further characterized with MRI if clinically warranted.
4. Stable low-density right renal lesion and bilateral L5 pars
defects.

## 2018-12-30 IMAGING — CT CT CHEST W/ CM
2 of 5 series · 13 of 36 positions shown, 16 images · IV contrast (APPLIED)
Comparison: CT the abdomen and pelvis 12/11/2015. CT the chest
11/30/2014.

CLINICAL DATA: 40-year-old male with history of stage I testicular
cancer. Followup examination.

EXAM:
CT CHEST, ABDOMEN, AND PELVIS WITH CONTRAST
TECHNIQUE: Multidetector CT imaging of the chest, abdomen and pelvis was
performed following the standard protocol during bolus
administration of intravenous contrast.
CONTRAST:  100mL [id number] IOPAMIDOL ([id number]) INJECTION 61%

[Series 2: cap with 2 · axial · 0.98mm/px · z∈[-791,-126]mm · 10 of 163 slices shown, 13 images]
[im 15/163  mediastinal]
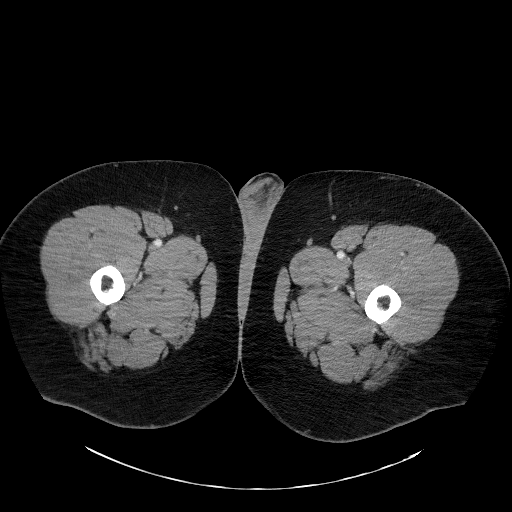
[im 15/163  lung]
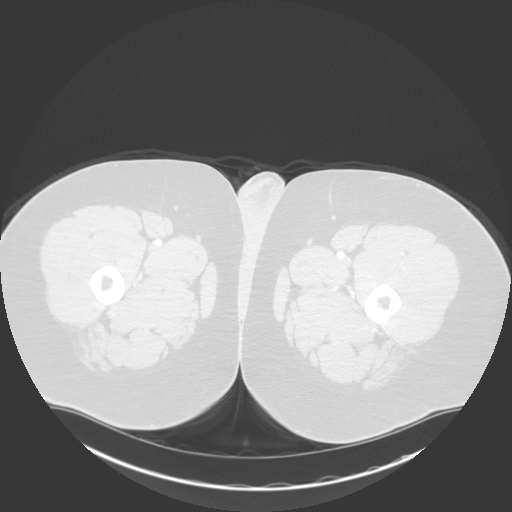
[im 30/163  lung]
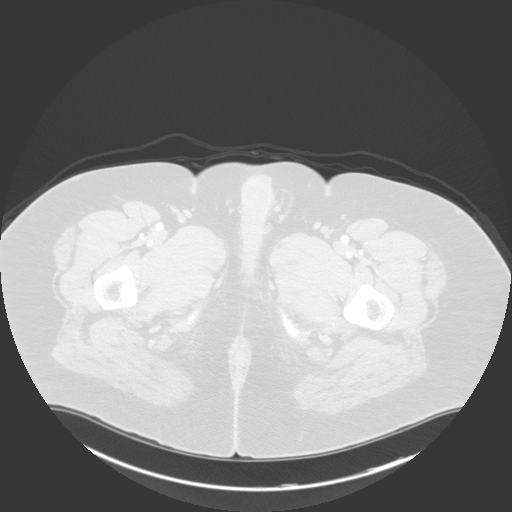
[im 45/163  lung]
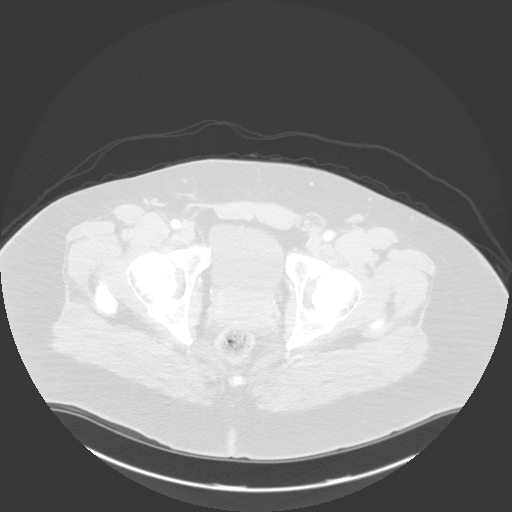
[im 59/163  lung]
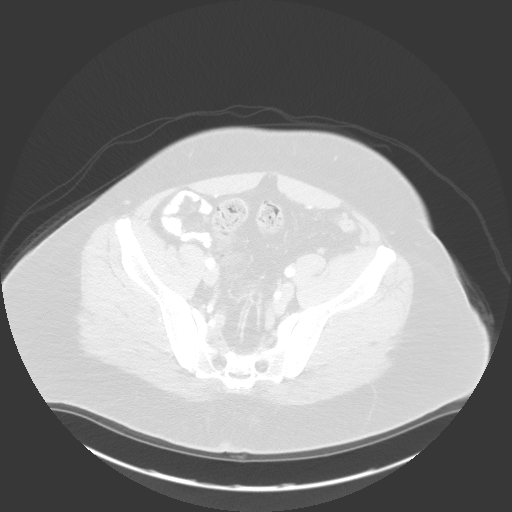
[im 74/163  mediastinal]
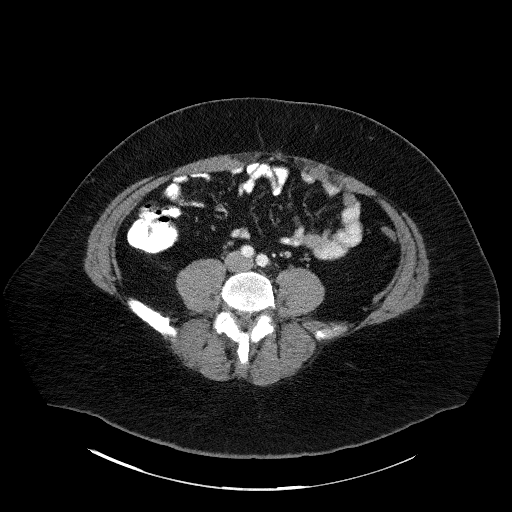
[im 74/163  lung]
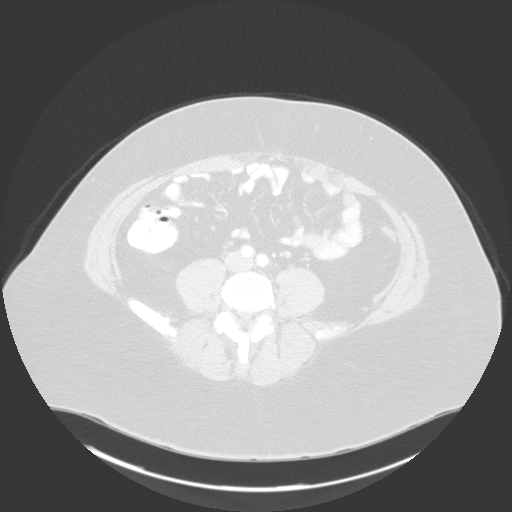
[im 89/163  lung]
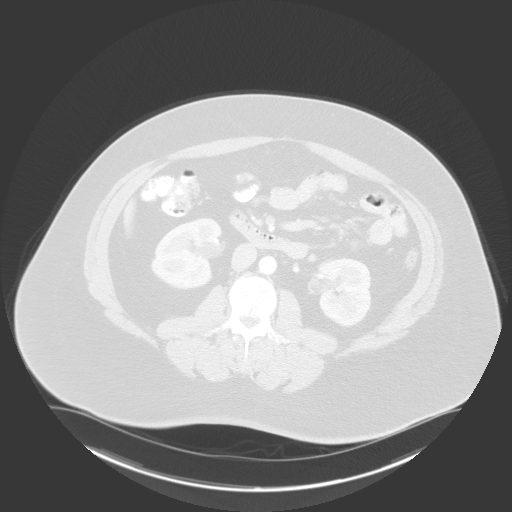
[im 104/163  lung]
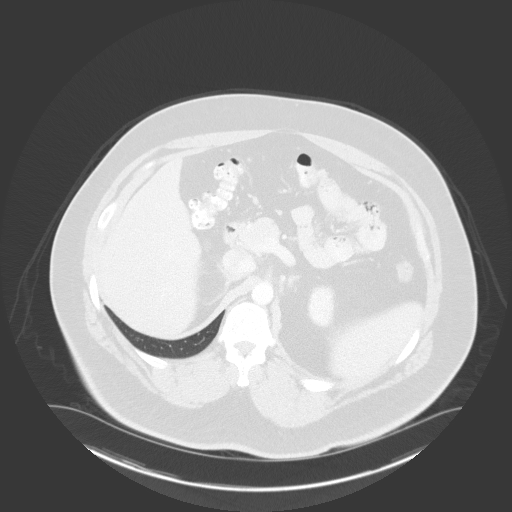
[im 118/163  lung]
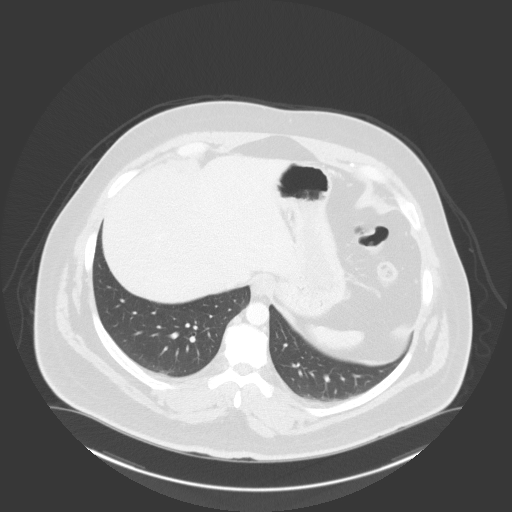
[im 133/163  mediastinal]
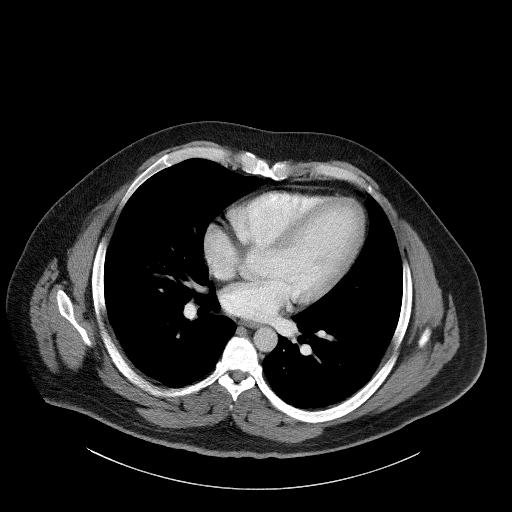
[im 133/163  lung]
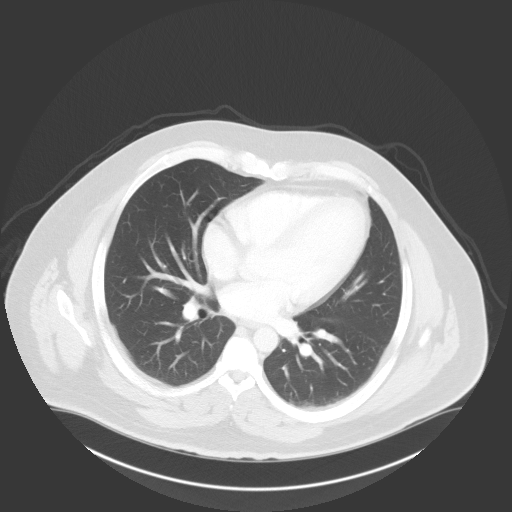
[im 148/163  lung]
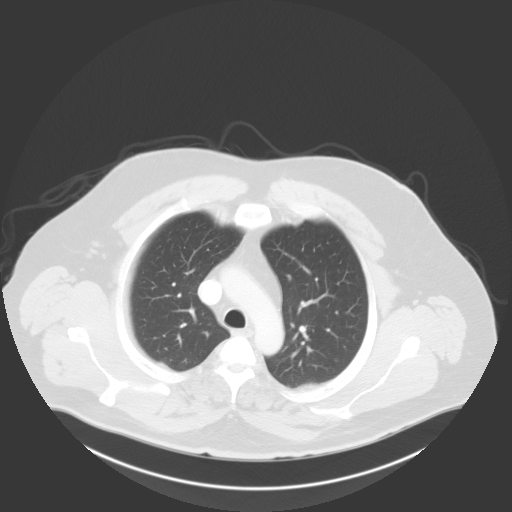

[Series 4: coronals · coronal · 1.09mm/px · 3 of 199 slices shown]
[im 40/199  lung]
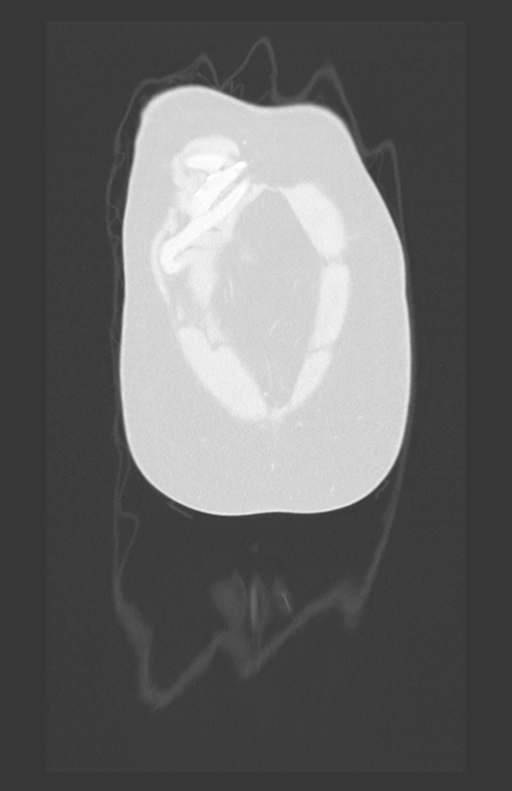
[im 80/199  lung]
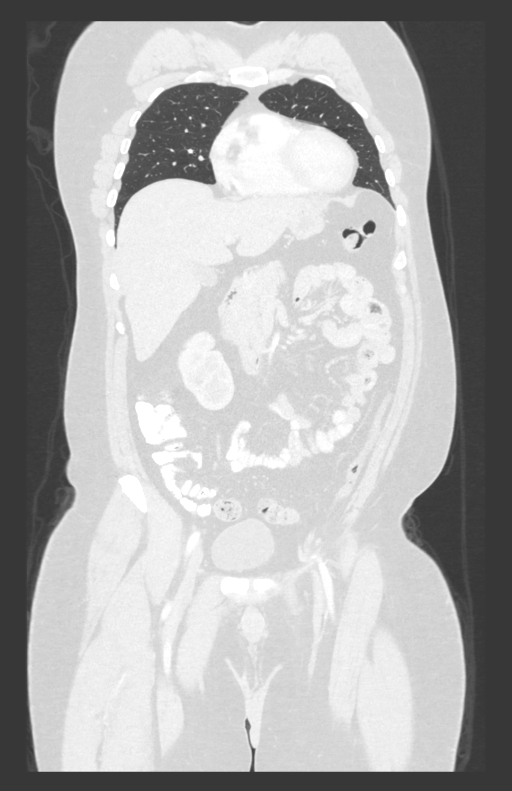
[im 119/199  lung]
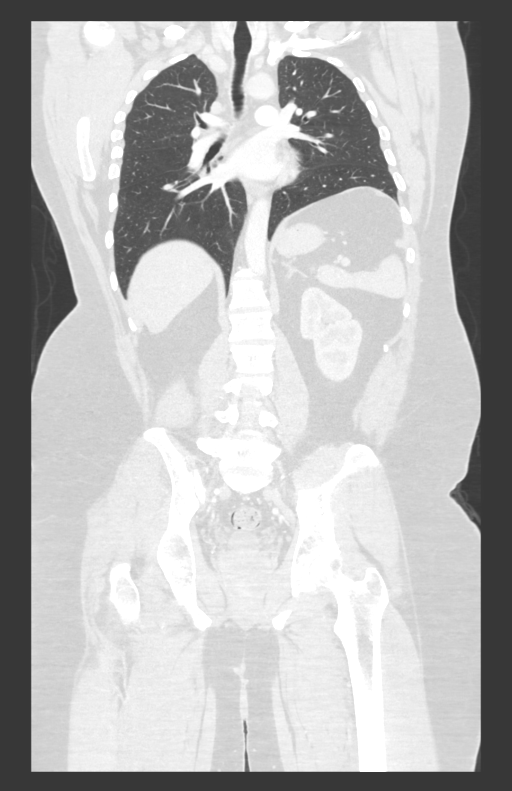

[13 of 36 positions shown; findings below may reference images not displayed]

FINDINGS: CT CHEST FINDINGS

Cardiovascular: Heart size is normal. There is no significant
pericardial fluid, thickening or pericardial calcification. No
atherosclerotic calcifications identified in the thoracic aorta or
the coronary arteries.

Mediastinum/Nodes: No pathologically enlarged mediastinal or hilar
lymph nodes. Esophagus is unremarkable in appearance. No axillary
lymphadenopathy.

Lungs/Pleura: No suspicious appearing pulmonary nodules or masses.
No acute consolidative airspace disease. No pleural effusions.

Musculoskeletal: There are no aggressive appearing lytic or blastic
lesions noted in the visualized portions of the skeleton.

CT ABDOMEN PELVIS FINDINGS

Hepatobiliary: Tiny 6 mm low-attenuation lesion in segment 8 of the
liver near the dome is stable compared to prior examinations,
incompletely characterize due to its small size, however, the
behavior of this lesion suggests a benign lesion such as a tiny
cysts. No suspicious hepatic lesions are otherwise noted. No intra
or extrahepatic biliary ductal dilatation. Gallbladder is normal in
appearance.

Pancreas: No pancreatic mass. No pancreatic ductal dilatation. No
pancreatic or peripancreatic fluid or inflammatory changes.

Spleen: Unremarkable.

Adrenals/Urinary Tract: Subcentimeter low-attenuation lesion in the
posterior aspect of the interpolar region of the right kidney is too
small to characterize, but stable compared to the prior study,
likely tiny cysts. Left kidney and bilateral adrenal glands are
normal in appearance. There is no hydroureteronephrosis. Urinary
bladder is normal in appearance.

Stomach/Bowel: The appearance of the stomach is normal. There is no
pathologic dilatation of small bowel or colon. Normal appendix.

Vascular/Lymphatic: No significant atherosclerotic disease, aneurysm
or dissection identified in the abdominal or pelvic vasculature. No
lymphadenopathy noted in the abdomen or pelvis. Several prominent
but nonenlarged retroperitoneal lymph nodes remain stable compared
to prior examinations and are nonspecific.

Reproductive: Prostate gland and seminal vesicles are unremarkable
in appearance. Postoperative changes of right-sided orchiectomy are
noted.

Other: No significant volume of ascites.  No pneumoperitoneum.

Musculoskeletal: There are no aggressive appearing lytic or blastic
lesions noted in the visualized portions of the skeleton.
IMPRESSION: 1. Status post right orchiectomy. No findings to suggest metastatic
disease in the chest, abdomen or pelvis.
2. Tiny subcentimeter low-attenuation lesion in the superior aspect
of the right lobe of the liver, stable compared to prior studies,
presumably a benign lesion such as a tiny cyst.

## 2019-06-27 IMAGING — CT CT CHEST W/ CM
2 of 5 series · 13 of 36 positions shown, 16 images · IV contrast (APPLIED)
Comparison: 06/13/2016

CLINICAL DATA: Followup testicular cancer

EXAM:
CT CHEST, ABDOMEN, AND PELVIS WITH CONTRAST
TECHNIQUE: Multidetector CT imaging of the chest, abdomen and pelvis was
performed following the standard protocol during bolus
administration of intravenous contrast.
CONTRAST:  100mL [id number] IOPAMIDOL ([id number]) INJECTION 61%

[Series 2: cap with 2 · axial · 0.96mm/px · z∈[-738,-54]mm · 10 of 169 slices shown, 13 images]
[im 16/169  mediastinal]
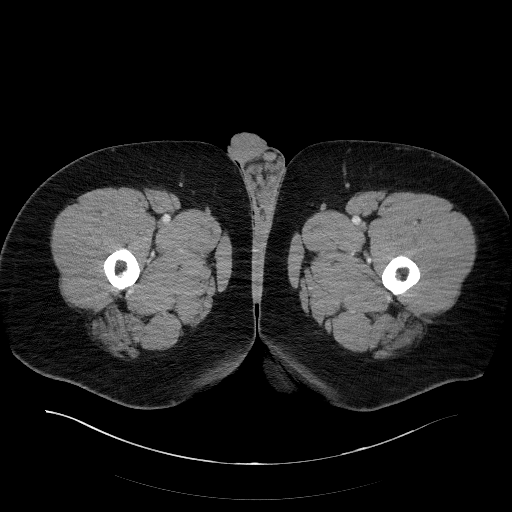
[im 16/169  lung]
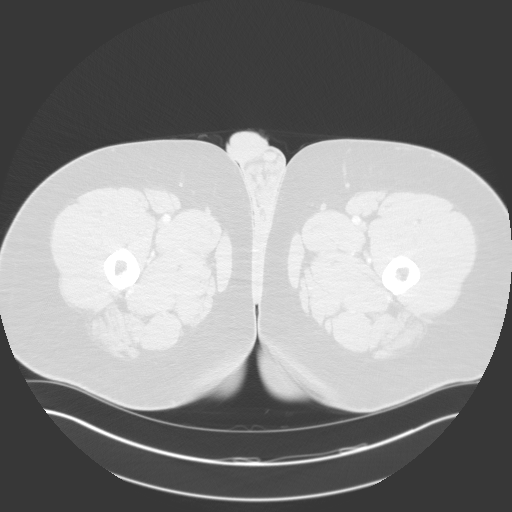
[im 31/169  lung]
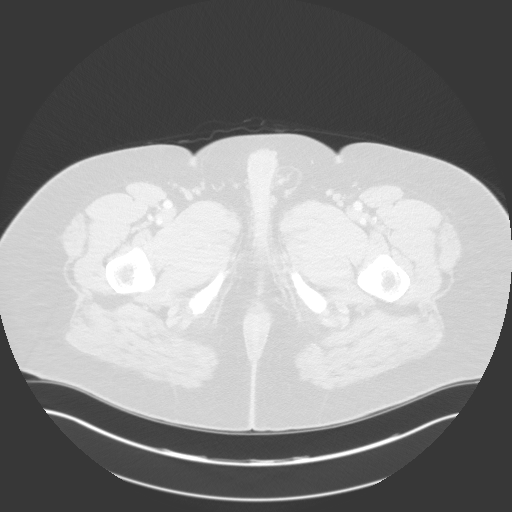
[im 46/169  lung]
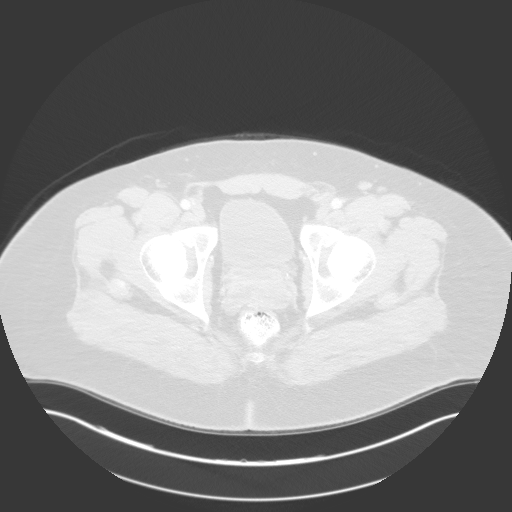
[im 62/169  lung]
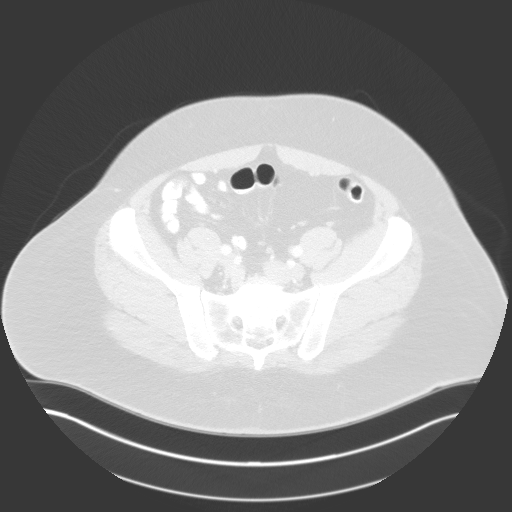
[im 77/169  mediastinal]
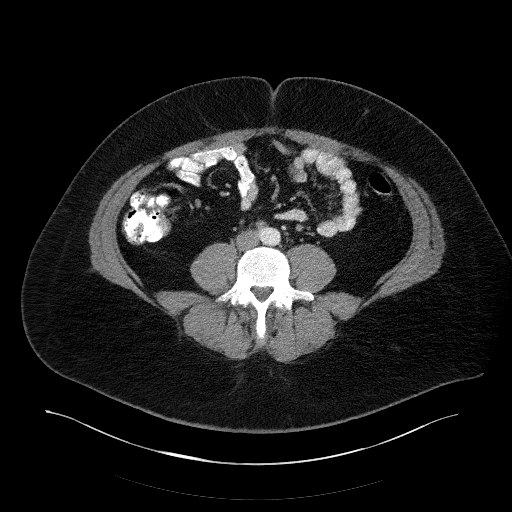
[im 77/169  lung]
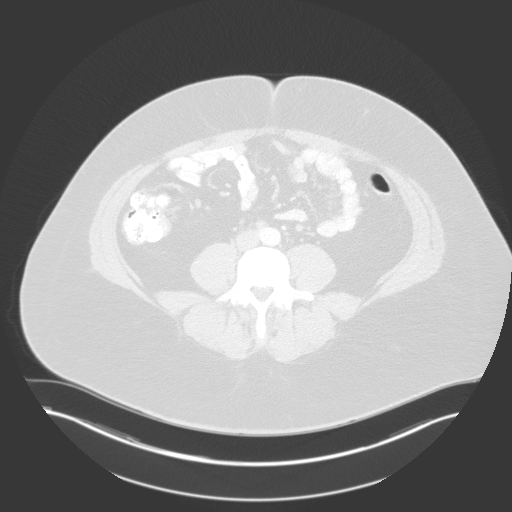
[im 92/169  lung]
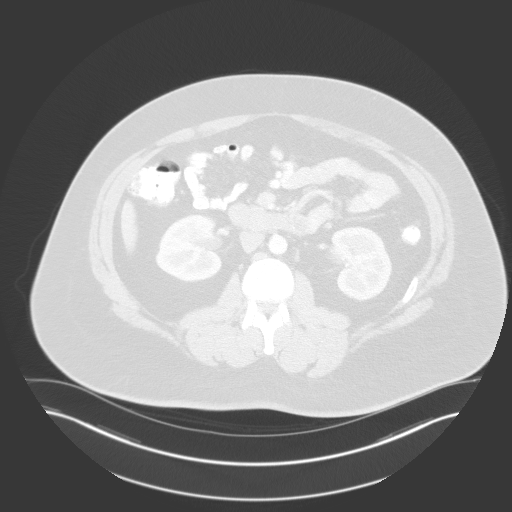
[im 107/169  lung]
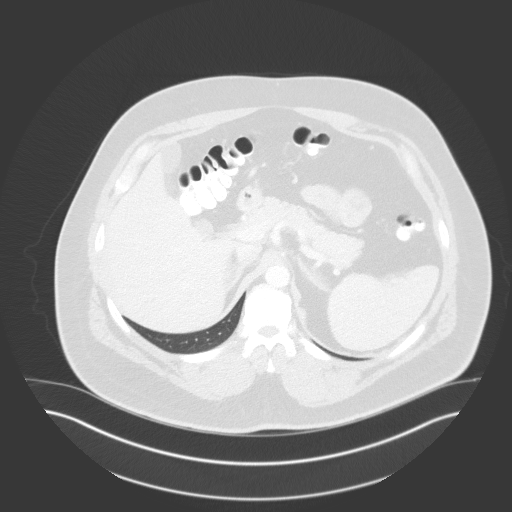
[im 123/169  lung]
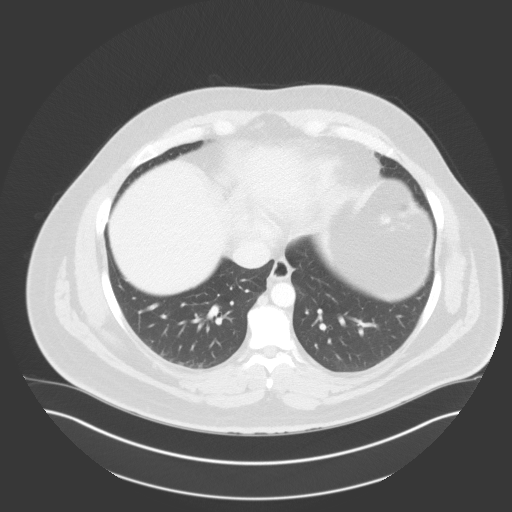
[im 138/169  mediastinal]
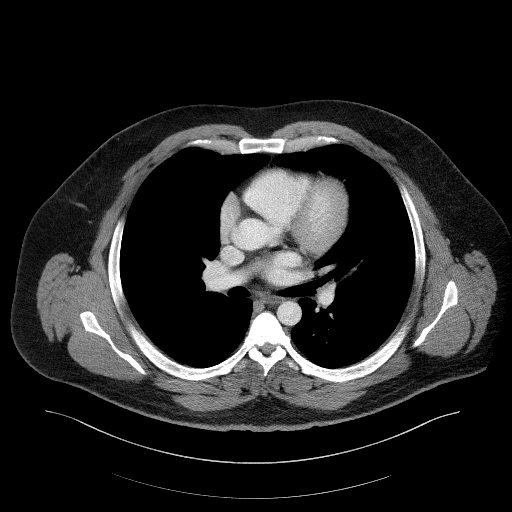
[im 138/169  lung]
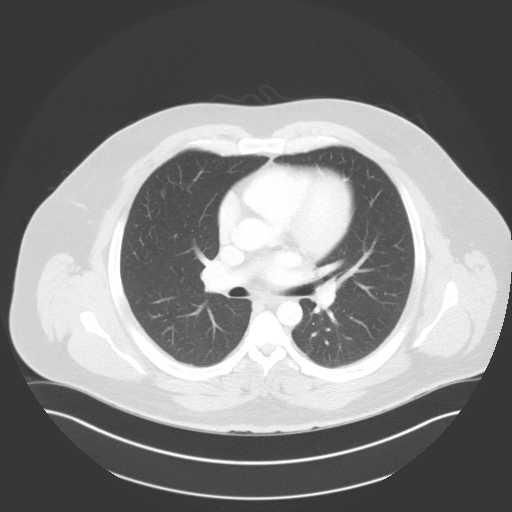
[im 153/169  lung]
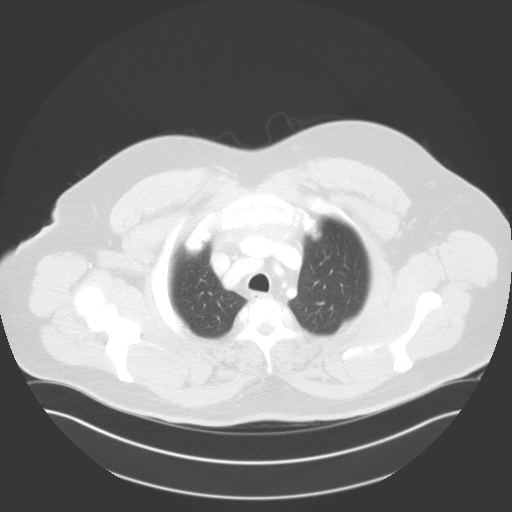

[Series 4: coronals · coronal · 1.15mm/px · 3 of 186 slices shown]
[im 38/186  lung]
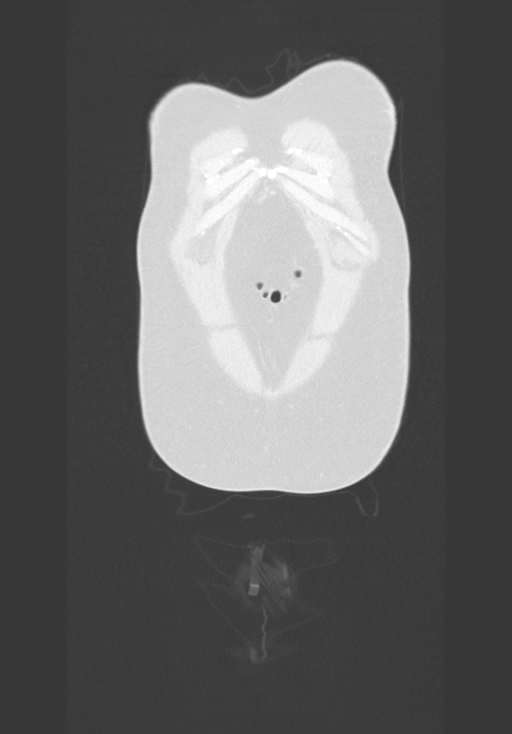
[im 75/186  lung]
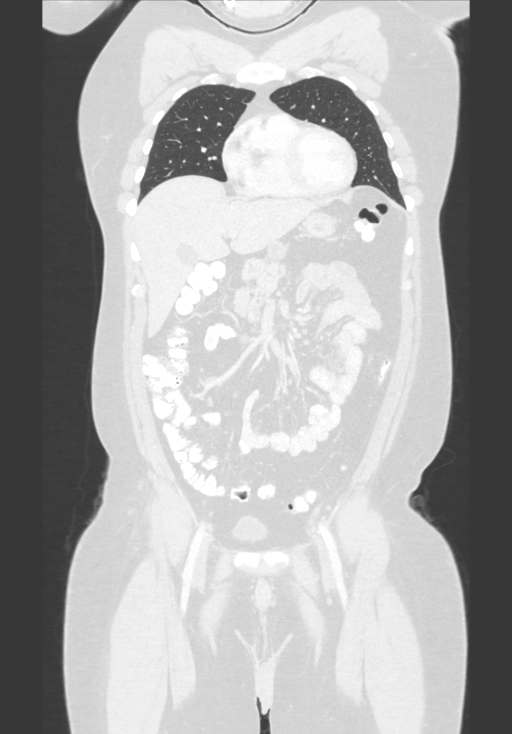
[im 112/186  lung]
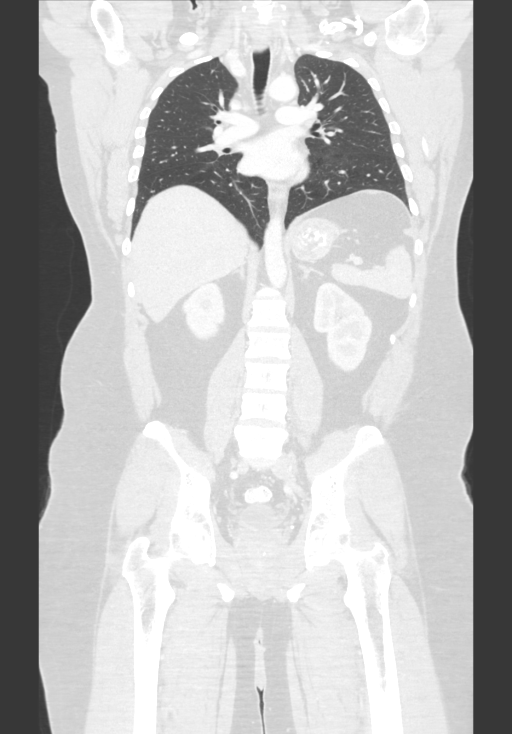

[13 of 36 positions shown; findings below may reference images not displayed]

FINDINGS: CT CHEST FINDINGS

Cardiovascular: Normal heart size. No pericardial effusion
identified.

Mediastinum/Nodes: No enlarged mediastinal, hilar, or axillary lymph
nodes. Thyroid gland, trachea, and esophagus demonstrate no
significant findings.

Lungs/Pleura: Lungs are clear. No pleural effusion or pneumothorax.

Musculoskeletal: No chest wall mass or suspicious bone lesions
identified.

CT ABDOMEN PELVIS FINDINGS

Hepatobiliary: No focal liver abnormality is seen. No gallstones,
gallbladder wall thickening, or biliary dilatation.

Pancreas: Unremarkable. No pancreatic ductal dilatation or
surrounding inflammatory changes.

Spleen: Normal in size without focal abnormality.

Adrenals/Urinary Tract: Adrenal glands are unremarkable. Kidneys are
normal, without renal calculi, focal lesion, or hydronephrosis.
Bladder is unremarkable.

Stomach/Bowel: Stomach is within normal limits. Appendix appears
normal. No evidence of bowel wall thickening, distention, or
inflammatory changes.

Vascular/Lymphatic: Stable borderline enlarged aortocaval lymph node
measures 1 cm, image 92 of series 2.

Reproductive: Prostate is unremarkable.

Other: No abdominal wall hernia or abnormality. No abdominopelvic
ascites.

Musculoskeletal: Bilateral L5 pars defects identified.
IMPRESSION: 1. No acute findings and no specific features identified to suggest
metastatic disease from patient's testicular cancer.
2. Borderline aortocaval lymph node is stable compared with
12/11/2015 when it measured 1 cm.
